# Patient Record
Sex: Female | Born: 1970 | Race: Asian | Hispanic: No | Smoking: Never smoker
Health system: Southern US, Community
[De-identification: ages and names within clinical notes are randomized; demographics above are authoritative.]

## PROBLEM LIST (undated history)

## (undated) DIAGNOSIS — J309 Allergic rhinitis, unspecified: Secondary | ICD-10-CM

## (undated) DIAGNOSIS — M1990 Unspecified osteoarthritis, unspecified site: Secondary | ICD-10-CM

## (undated) DIAGNOSIS — R634 Abnormal weight loss: Secondary | ICD-10-CM

## (undated) DIAGNOSIS — F419 Anxiety disorder, unspecified: Secondary | ICD-10-CM

## (undated) DIAGNOSIS — B1910 Unspecified viral hepatitis B without hepatic coma: Secondary | ICD-10-CM

## (undated) DIAGNOSIS — B181 Chronic viral hepatitis B without delta-agent: Secondary | ICD-10-CM

## (undated) DIAGNOSIS — K219 Gastro-esophageal reflux disease without esophagitis: Secondary | ICD-10-CM

## (undated) DIAGNOSIS — M549 Dorsalgia, unspecified: Secondary | ICD-10-CM

## (undated) DIAGNOSIS — K2970 Gastritis, unspecified, without bleeding: Secondary | ICD-10-CM

## (undated) DIAGNOSIS — G43909 Migraine, unspecified, not intractable, without status migrainosus: Secondary | ICD-10-CM

## (undated) DIAGNOSIS — I10 Essential (primary) hypertension: Secondary | ICD-10-CM

## (undated) HISTORY — DX: Unspecified osteoarthritis, unspecified site: M19.90

## (undated) HISTORY — DX: Anxiety disorder, unspecified: F41.9

## (undated) HISTORY — PX: OTHER SURGICAL HISTORY: SHX169

## (undated) HISTORY — DX: Migraine, unspecified, not intractable, without status migrainosus: G43.909

## (undated) HISTORY — DX: Abnormal weight loss: R63.4

## (undated) HISTORY — DX: Dorsalgia, unspecified: M54.9

## (undated) HISTORY — PX: CHOLECYSTECTOMY: SHX55

## (undated) HISTORY — DX: Gastro-esophageal reflux disease without esophagitis: K21.9

## (undated) HISTORY — DX: Essential (primary) hypertension: I10

## (undated) HISTORY — DX: Allergic rhinitis, unspecified: J30.9

## (undated) HISTORY — DX: Gastritis, unspecified, without bleeding: K29.70

## (undated) HISTORY — DX: Unspecified viral hepatitis B without hepatic coma: B19.10

## (undated) HISTORY — DX: Chronic viral hepatitis B without delta-agent: B18.1

---

## 1998-02-16 ENCOUNTER — Encounter: Payer: Self-pay | Admitting: Emergency Medicine

## 1998-02-16 ENCOUNTER — Emergency Department (HOSPITAL_COMMUNITY): Admission: EM | Admit: 1998-02-16 | Discharge: 1998-02-16 | Payer: Self-pay | Admitting: Emergency Medicine

## 1998-08-01 ENCOUNTER — Emergency Department (HOSPITAL_COMMUNITY): Admission: EM | Admit: 1998-08-01 | Discharge: 1998-08-01 | Payer: Self-pay | Admitting: Emergency Medicine

## 1998-08-01 ENCOUNTER — Encounter: Payer: Self-pay | Admitting: Emergency Medicine

## 1998-08-19 ENCOUNTER — Encounter: Payer: Self-pay | Admitting: Family Medicine

## 1998-08-19 ENCOUNTER — Ambulatory Visit (HOSPITAL_COMMUNITY): Admission: RE | Admit: 1998-08-19 | Discharge: 1998-08-19 | Payer: Self-pay | Admitting: Family Medicine

## 1999-06-27 ENCOUNTER — Encounter: Payer: Self-pay | Admitting: Emergency Medicine

## 1999-06-27 ENCOUNTER — Emergency Department (HOSPITAL_COMMUNITY): Admission: EM | Admit: 1999-06-27 | Discharge: 1999-06-27 | Payer: Self-pay | Admitting: *Deleted

## 1999-07-03 ENCOUNTER — Encounter (INDEPENDENT_AMBULATORY_CARE_PROVIDER_SITE_OTHER): Payer: Self-pay | Admitting: Specialist

## 1999-07-03 ENCOUNTER — Observation Stay (HOSPITAL_COMMUNITY): Admission: RE | Admit: 1999-07-03 | Discharge: 1999-07-05 | Payer: Self-pay

## 1999-07-27 ENCOUNTER — Encounter: Admission: RE | Admit: 1999-07-27 | Discharge: 1999-07-27 | Payer: Self-pay

## 1999-12-31 ENCOUNTER — Encounter: Payer: Self-pay | Admitting: Family Medicine

## 1999-12-31 ENCOUNTER — Ambulatory Visit (HOSPITAL_COMMUNITY): Admission: RE | Admit: 1999-12-31 | Discharge: 1999-12-31 | Payer: Self-pay | Admitting: Family Medicine

## 2000-03-24 ENCOUNTER — Encounter: Payer: Self-pay | Admitting: *Deleted

## 2000-03-24 ENCOUNTER — Encounter: Admission: RE | Admit: 2000-03-24 | Discharge: 2000-03-24 | Payer: Self-pay | Admitting: *Deleted

## 2002-08-12 HISTORY — PX: LIVER BIOPSY: SHX301

## 2002-08-14 ENCOUNTER — Encounter: Payer: Self-pay | Admitting: *Deleted

## 2002-08-14 ENCOUNTER — Ambulatory Visit (HOSPITAL_COMMUNITY): Admission: RE | Admit: 2002-08-14 | Discharge: 2002-08-14 | Payer: Self-pay | Admitting: *Deleted

## 2002-08-14 ENCOUNTER — Encounter (INDEPENDENT_AMBULATORY_CARE_PROVIDER_SITE_OTHER): Payer: Self-pay | Admitting: Specialist

## 2002-09-12 HISTORY — PX: ESOPHAGOGASTRODUODENOSCOPY: SHX1529

## 2002-10-04 ENCOUNTER — Ambulatory Visit (HOSPITAL_COMMUNITY): Admission: RE | Admit: 2002-10-04 | Discharge: 2002-10-04 | Payer: Self-pay | Admitting: *Deleted

## 2003-10-01 ENCOUNTER — Other Ambulatory Visit: Admission: RE | Admit: 2003-10-01 | Discharge: 2003-10-01 | Payer: Self-pay | Admitting: Gynecology

## 2004-04-21 ENCOUNTER — Inpatient Hospital Stay (HOSPITAL_COMMUNITY): Admission: AD | Admit: 2004-04-21 | Discharge: 2004-04-21 | Payer: Self-pay | Admitting: Gynecology

## 2004-04-30 ENCOUNTER — Other Ambulatory Visit: Admission: RE | Admit: 2004-04-30 | Discharge: 2004-04-30 | Payer: Self-pay | Admitting: Gynecology

## 2004-07-11 ENCOUNTER — Emergency Department (HOSPITAL_COMMUNITY): Admission: EM | Admit: 2004-07-11 | Discharge: 2004-07-11 | Payer: Self-pay | Admitting: Emergency Medicine

## 2004-07-12 ENCOUNTER — Inpatient Hospital Stay (HOSPITAL_COMMUNITY): Admission: RE | Admit: 2004-07-12 | Discharge: 2004-07-16 | Payer: Self-pay | Admitting: Gynecology

## 2004-08-10 ENCOUNTER — Inpatient Hospital Stay (HOSPITAL_COMMUNITY): Admission: AD | Admit: 2004-08-10 | Discharge: 2004-08-12 | Payer: Self-pay | Admitting: Gynecology

## 2004-11-30 ENCOUNTER — Inpatient Hospital Stay (HOSPITAL_COMMUNITY): Admission: AD | Admit: 2004-11-30 | Discharge: 2004-12-02 | Payer: Self-pay | Admitting: Gynecology

## 2006-07-25 ENCOUNTER — Inpatient Hospital Stay (HOSPITAL_COMMUNITY): Admission: RE | Admit: 2006-07-25 | Discharge: 2006-07-27 | Payer: Self-pay | Admitting: Obstetrics & Gynecology

## 2007-03-02 ENCOUNTER — Encounter: Admission: RE | Admit: 2007-03-02 | Discharge: 2007-03-02 | Payer: Self-pay | Admitting: Gastroenterology

## 2008-07-09 ENCOUNTER — Ambulatory Visit: Payer: Self-pay | Admitting: Diagnostic Radiology

## 2008-07-09 ENCOUNTER — Emergency Department (HOSPITAL_BASED_OUTPATIENT_CLINIC_OR_DEPARTMENT_OTHER): Admission: EM | Admit: 2008-07-09 | Discharge: 2008-07-09 | Payer: Self-pay | Admitting: Emergency Medicine

## 2008-11-12 ENCOUNTER — Other Ambulatory Visit: Admission: RE | Admit: 2008-11-12 | Discharge: 2008-11-12 | Payer: Self-pay | Admitting: Family Medicine

## 2009-06-17 ENCOUNTER — Encounter: Admission: RE | Admit: 2009-06-17 | Discharge: 2009-06-17 | Payer: Self-pay | Admitting: Family Medicine

## 2010-02-01 ENCOUNTER — Encounter: Payer: Self-pay | Admitting: Family Medicine

## 2010-04-20 LAB — DIFFERENTIAL
Basophils Absolute: 0 10*3/uL (ref 0.0–0.1)
Basophils Relative: 0 % (ref 0–1)
Lymphs Abs: 0.9 10*3/uL (ref 0.7–4.0)
Monocytes Absolute: 0.3 10*3/uL (ref 0.1–1.0)
Monocytes Relative: 8 % (ref 3–12)
Neutrophils Relative %: 64 % (ref 43–77)

## 2010-04-20 LAB — COMPREHENSIVE METABOLIC PANEL
Albumin: 4.7 g/dL (ref 3.5–5.2)
Alkaline Phosphatase: 88 U/L (ref 39–117)
BUN: 9 mg/dL (ref 6–23)
Calcium: 9.7 mg/dL (ref 8.4–10.5)
GFR calc Af Amer: >60 mL/min (ref >60)
Potassium: 4.1 mEq/L (ref 3.5–5.1)
Sodium: 142 mEq/L (ref 135–145)
Total Bilirubin: 0.6 mg/dL (ref 0.3–1.2)

## 2010-04-20 LAB — URINALYSIS, ROUTINE W REFLEX MICROSCOPIC
Bilirubin Urine: NEGATIVE
Ketones, ur: NEGATIVE mg/dL
Leukocytes, UA: NEGATIVE
Protein, ur: NEGATIVE mg/dL
Urobilinogen, UA: 0.2 mg/dL (ref 0.0–1.0)

## 2010-04-20 LAB — CBC
MCHC: 33.3 g/dL (ref 30.0–36.0)
RBC: 4.54 MIL/uL (ref 3.87–5.11)

## 2010-04-20 LAB — PREGNANCY, URINE: Preg Test, Ur: NEGATIVE

## 2010-04-20 LAB — POCT CARDIAC MARKERS

## 2010-04-20 LAB — URINE MICROSCOPIC-ADD ON

## 2010-05-29 NOTE — Discharge Summary (Signed)
[name], [name]                   ACCOUNT NO.:  [id number]   MEDICAL RECORD NO.:  [id number]          PATIENT TYPE:  INP   LOCATION:  9317                          FACILITY:  [facility]   PHYSICIAN:  Ivor Costa. Farrel Gobble, M.D. DATE OF BIRTH:  10/11/1970   DATE OF ADMISSION:  08/10/2004  DATE OF DISCHARGE:                                 DISCHARGE SUMMARY   PRINCIPAL DIAGNOSES:  1.  Twenty-four weeks pregnancy.  2.  Suspected pyelonephritis.  3.  Chronic active hepatitis.  4.  Sciatica.   Refer to the dictated H&P.   HOSPITAL COURSE:  The patient was admitted on August 10, 2004 with subjective  complaints of fever. She had a urinalysis which was performed which was  essentially negative; however, because of her fever she had urine culture  and blood cultures also done. The patient was prophylactically put on Unasyn  q.6h. and remained afebrile throughout her hospital course. She was seen by  urology as she had been seen by urology earlier. Her urine culture came back  positive for gram negative rods which turned out to be Klebsiella pneumoniae  resistant to everything except for Bactrim. The blood cultures were no  growth to date. The patient was discharged home on hospital day #2 with a  prescription for Bactrim b.i.d. #14 with instructions to follow up with  urology. The patient will probably be placed on suppression as a result of  the recurrent urinary tract infections.   PHYSICAL EXAMINATION AT THE TIME OF DISCHARGE:  GENERAL:  She was without  complaints.  HEART:  Regular rate.  LUNGS:  Clear to auscultation.  ABDOMEN:  Gravid and nontender. She had no costovertebral angle tenderness.  EXTREMITIES:  Negative.   Discharge prescriptions as mentioned above. She will follow up with our  office this week and will call to follow up with the urologist for later  this month.      Ivor Costa. Farrel Gobble, M.D.  Electronically Signed     THL/MEDQ  D:  08/12/2004  T:  08/12/2004  Job:   [id number]

## 2010-05-29 NOTE — Op Note (Signed)
[facility]  Patient:    [name], [name]                            MRN: [id number] Proc. Date: 07/03/99 Adm. Date:  [id number] Disc. Date: [id number] Attending:  Meredith Leeds CC:         Wilhemina Bonito. Eda Keys., M.D. [facility]                           Operative Report  PREOPERATIVE DIAGNOSIS:  Chololithiasis, possible choledocholithiasis.  POSTOPERATIVE DIAGNOSIS:  Chololithiasis and choledocholithiasis.  OPERATION:  Laparoscopic cholecystectomy with operative cholangiogram and attempt at common duct exploration.  SURGEON:  Zigmund Daniel, M.D.  ANESTHESIA:  General.  DESCRIPTION OF PROCEDURE:  After adequate anesthesia, monitoring, and routine preparation and draping of the abdomen, I made a short infraumbilical incision and dissected down to the fascia, opened it longitudinally, open the peritoneum bluntly, placed an 0 Vicryl pursestring in the fascia and secured a Hasson cannula, then inflated the abdomen with CO2.  Laparoscopy disclosed no abnormalities.  I put in three additional ports, positioned the patient head up, foot down and tilted to the left, then elevated the fundus of the gallbladder over the liver, grasped the infundibulum and retracted it laterally.  Because the patient was quite slender, the anatomy was quite clear.  The common bile duct appeared slightly dilated.  The cystic duct was small.  I dissected out the cystic duct and the cystic artery.  I clipped the cystic artery with three clips and cut between the two clips closer to the gallbladder.  I put a clip on the distal infundibulum of the gallbladder and made a small hole in the proximal part of the cystic duct and then put in a cholangiogram catheter and secured it with a clip.  There was free flow of saline and contrast.  I did a fluoroscopic cholangiogram which demonstrated the filling defects in the mid part of the common bile duct.  There was free flow of the contrast into  the duodenum.  The bile duct was somewhat large.  I withdrew the cholangiogram catheter and then attempted to dilate the cystic duct to put in a choledochoscope.  I dilated it with the forks of the right angle dissector, attempted passage of a 3 mm dilator over a guidewire.  I could not get even the 3 mm dilator to go into the cystic duct and it appeared to be about to break into.  I felt that exploration the common duct through the cystic duct would not very likely be satisfactory.  I thought the method to proceed at that point would be to finish the operation and have ERCP done postoperatively.  I removed the wire and the dilators and then placed three clips distally on the cystic duct and divided it.  I dissected the gallbladder out of the liver bed using the spatula and the L-hook dissector with cautery. Hemostasis was good.  There was no evident leakage of bile.  All the clips appeared to be secure and hemostasis was excellent.  I removed the gallbladder from the abdomen through the umbilical incision and tied the pursestring suture.  I anesthetized all the incisions with 0.5% Marcaine with epinephrine. After relief of the CO2 I removed all the ports and closed the incisions with intracuticular 4-0 Vicryl and Steri-Strips.  The patient tolerated the operation well. DD:  07/03/99  TD:  07/06/99 Job: [id number]

## 2010-05-29 NOTE — H&P (Signed)
[name], [name]                   ACCOUNT NO.:  [id number]   MEDICAL RECORD NO.:  [id number]          PATIENT TYPE:  INP   LOCATION:  9127                          FACILITY:  [facility]   PHYSICIAN:  Ivor Costa. Farrel Gobble, M.D. DATE OF BIRTH:  11-03-1970   DATE OF ADMISSION:  11/30/2004  DATE OF DISCHARGE:                                HISTORY & PHYSICAL   CHIEF COMPLAINT:  Labor.   HISTORY OF PRESENT ILLNESS:  The patient is a questionable 40 year old G2,  P1 whose LMP was February 20, 2004, estimated date of confinement is November 27, 2004, estimated gestational age is 40-3/7 weeks who presented to the  office today with plans for induction in the morning of December 01, 2004  with complaints of contractions since early today.  She is currently  contracting by history about every half-hour. She was noted to be 4 cm  dilated with a bulging bag and minus 2 station.  The patient had light  vaginal spotting only. She reports good fetal movement.  Her pregnancy has  been complicated by chronic active hepatitis.  She also a history of  thrombocytopenia secondary to her hepatitis.  She was on Hepsera at the  point when she conceived this pregnancy. The patient is unsure of her birth  year but states that she is at least 40 years old but no amniocentesis was  done because of her chronic active hepatitis.  her platelet counts and liver  function tests have been checked regularly secondary to the hepatitis.  Her  last platelet count was 168,000.  She is B positive, antibody negative,  Rubella immune, hepatitis B surface antigen reactive, human immunodeficiency  virus nonreactive. AFP was declined.  GBS negative.  Glucola was 119.  The  patient had hepatitis A and hepatitis C serologies also done during this  pregnancy both of which were negative.  She is positive for hepatitis A  antibody, however, she is negative entirely for hepatitis C.  Please refer  to the [facility] for further information.   PHYSICAL EXAMINATION:  GENERAL APPEARANCE:  She is a well-appearing gravida  in mild distress with contractions.  VITAL SIGNS:  Blood pressure 102/60, weight 135 which is a 47 pound weight  gain.  Her urine dip was negative.  HEART:  Regular rate.  LUNGS:  Clear to auscultation.  ABDOMEN:  Gravid, soft, nontender with fundal height of 34 cm.  PELVIC:  On vaginal examination she was 4 cm, 50, minus 2 with a bulging  bag.  EXTREMITIES: Negative.   ASSESSMENT:  Term pregnancy in labor with chronic active hepatitis with  resultant thrombocytopenia not present at this point.  The patient was  transferred from our office to [facility] for management of her labor  including AROM and Pit. if necessary.      Ivor Costa. Farrel Gobble, M.D.  Electronically Signed     THL/MEDQ  D:  11/30/2004  T:  11/30/2004  Job:  [id number]

## 2010-05-29 NOTE — Consult Note (Signed)
[name], [name]                   ACCOUNT NO.:  [id number]   MEDICAL RECORD NO.:  [id number]          PATIENT TYPE:  INP   LOCATION:  9317                          FACILITY:  [facility]   PHYSICIAN:  Ronald L. Earlene Plater, M.D.  DATE OF BIRTH:  10/11/1970   DATE OF CONSULTATION:  DATE OF DISCHARGE:                                   CONSULTATION   REASON FOR CONSULTATION:  This is a consult note for patient being  readmitted for increased temperature, right flank pain, and previous right  hydronephrosis, possible kidney stone. Over the weekend on Saturday, the  patient began  having right flank pain again, increased temperature, no  hematuria, no nausea, no vomiting, and has had diarrhea for the last 2  weeks. Urgency and frequency as expected with pregnancy. Her pregnancy EDC  is November 26, 2004 with a gestational age of [redacted] weeks and four-sevenths  days.   UROLOGICAL HISTORY:  She has a remote history of kidney stones. She was  previously hospitalized in early July for right flank pain, increased  temperature. On renal ultrasound was found to have right hydronephrosis and  right hydroureter and suspected kidney stone. At that point her urine  culture was negative. She was followed up at [facility]. Renal  ultrasound was repeated after hospitalization and showed a decrease in right  hydronephrosis and a clear urine.   PAST MEDICAL HISTORY:  Includes hepatitis B previously on Hepsera, and GERD.   MEDICATIONS:  Include Nexium daily and prenatal vitamin daily.   ALLERGIES:  Include an ANTIREFLUX MEDICATION.   SOCIAL HISTORY:  Negative alcohol, negative tobacco, negative illicit drug  use.   REVIEW OF SYSTEMS:  Otherwise negative.   PHYSICAL EXAMINATION:  GENERAL:  Well-appearing 40 year old gravid Asian  female, no acute distress.  VITAL SIGNS:  Stable, afebrile. Temperature max 100.3 on admission.  HEENT:  Atraumatic.  ABDOMEN:  Soft, gravid, nontender. Negative CVAT.  SKIN:   Warm and dry.  NEUROLOGIC:  Alert and oriented x3.  LOWER EXTREMITIES:  Negative edema.   LABORATORY DATA:  Urine culture showed greater than 100,000 colonies of gram  negative rods. Her blood cultures are no growth. GC and chlamydia negative.  Renal ultrasound on this hospitalization shows no hydronephrosis, no  evidence of renal calculi, some right-sided ureterectasis present; however,  that is decreased from her renal ultrasound on July 16, 2004. Ureteral jets  are not visualized due to nearly complete bladder emptying.   IMPRESSION:  1.  Pregnancy at 24 and four-sevenths weeks - estimated date of confinement      November 26, 2004.  2.  Pyelonephritis with gram negative rods.  3.  Hepatitis B, chronic.  4.  Gastroesophageal reflux disease.   PLAN:  Continue current antibiotic therapy. Would recommend prophylactic  antibiotic treatment once current course is completed. Would recommend  cephalexin 125 mg p.o. daily. The patient currently has follow-up office  visit scheduled at [facility] in September. Would recommend keeping  that appointment and we will certainly continue to follow the patient during  this pregnancy.  JML/MEDQ  D:  08/11/2004  T:  08/11/2004  Job:  [id number]

## 2010-05-29 NOTE — Procedures (Signed)
[facility]  Patient:    [name], [name]                            MRN: [id number] Proc. Date: 07/04/99 Adm. Date:  [id number] Disc. Date: [id number] Attending:  Meredith Leeds CC:         Lebron Conners, M.D.                           Procedure Report  PROCEDURE:  Endoscopic retrograde cholangiopancreatography with biliary sphincterotomy.  Balloon sweeps of the common bile duct.  INDICATION:  Choledocholithiasis.  HISTORY:  This is a pleasant 40 year old [place] female who underwent laparoscopic cholecystectomy yesterday for symptomatic cholelithiasis.  She had mildly elevated preoperative liver tests.  Intraoperative cholangiogram suggested several filling defects in the common hepatic duct consistent with retained stones.  She was evaluated thoroughly in consultation yesterday for consideration of ERCP with sphincterotomy and stone extraction.  She was deemed an appropriate candidate with no contraindications.  The nature of this procedure as well as its risks, benefits and alternatives were reviewed in detail.  She understood and agreed to proceed.  PHYSICAL EXAMINATION:  GENERAL:  Well-appearing female in no acute distress.  She is alert and oriented.  VITAL SIGNS:  Stable.  LUNGS:  Clear.  HEART:  Regular.  ABDOMEN:  Soft with tenderness in and around the surgical stab wound site.  DESCRIPTION OF PROCEDURE:  After informed consent was obtained the patient was sedated with 50 mg of Demerol and 5 mg of Versed IV.  Glucagon 0.5 mg IV was given as a duodenal relaxant.  Unasyn 1.5 grams IV was given pre-procedurally. The Olympus side-viewing endoscope was passed blindly into the esophagus.  The stomach, duodenal bulb and post-bulbar duodenum were normal.  The major papilla was normal.  The minor papilla was not sought.  X-RAY FINDINGS: 1. Scout radiograph of the abdomen with the endoscope in position revealed    cholecystectomy clips  in the right upper quadrant. 2. Initial injection of contrast via the major papillae yielded normal    complete pancreatogram. 3. Access to the common bile duct was achieved via a hydrophilic wire.  The    cutting cannula was advanced into the bile duct.  Bile aspirated to    confirm location.  Subsequently complete filling of the biliary tree was    achieved.  The common bile duct diameter was approximately 5 mm.  There    was no evidence of filling defects in any portion of the common bile duct.    No evidence of bile leak.  THERAPY:  Despite lack of obvious filling defects biliary sphincterotomy was carried out due to the appearance of yesterdays radiograph as well as abnormal liver tests.  Moderate sized biliary sphincterotomy was made, cutting over the guidewire in the 12 oclock orientation.  Subsequently an 8.5 mm balloon was swept through the entire bile duct on several occasions.  No stones or debris extracted.  Post-procedure drainage of the biliary tree was excellent.  IMPRESSION:  Status post ERCP with biliary sphincterotomy and negative balloon sweeps of the bile duct as described above.  RECOMMENDATIONS:  Standard post-procedure orders with anticipated discharge in a.m. DD:  07/04/99 TD:  07/06/99 Job: [id number]

## 2010-07-03 ENCOUNTER — Other Ambulatory Visit: Payer: Self-pay | Admitting: Gastroenterology

## 2010-07-03 DIAGNOSIS — B181 Chronic viral hepatitis B without delta-agent: Secondary | ICD-10-CM

## 2010-08-14 ENCOUNTER — Ambulatory Visit (HOSPITAL_COMMUNITY)
Admission: RE | Admit: 2010-08-14 | Discharge: 2010-08-14 | Disposition: A | Discharge status: Home / Self Care | Payer: BC Managed Care – PPO | Source: Ambulatory Visit | Attending: Gastroenterology | Admitting: Gastroenterology

## 2010-08-14 DIAGNOSIS — D1803 Hemangioma of intra-abdominal structures: Secondary | ICD-10-CM | POA: Insufficient documentation

## 2010-08-14 DIAGNOSIS — B181 Chronic viral hepatitis B without delta-agent: Secondary | ICD-10-CM

## 2010-10-26 LAB — CBC
HCT: 25.9 — ABNORMAL LOW
MCV: 85.3
Platelets: 126 — ABNORMAL LOW
RDW: 14.2 — ABNORMAL HIGH

## 2010-10-27 LAB — CBC
HCT: 30.8 — ABNORMAL LOW
Hemoglobin: 10.3 — ABNORMAL LOW
MCV: 85.7
RBC: 3.59 — ABNORMAL LOW

## 2010-10-27 LAB — RPR: RPR Ser Ql: NONREACTIVE

## 2011-04-20 ENCOUNTER — Other Ambulatory Visit: Payer: Self-pay | Admitting: Gastroenterology

## 2011-04-20 DIAGNOSIS — B1910 Unspecified viral hepatitis B without hepatic coma: Secondary | ICD-10-CM

## 2011-04-20 DIAGNOSIS — C220 Liver cell carcinoma: Secondary | ICD-10-CM

## 2011-05-12 ENCOUNTER — Other Ambulatory Visit (HOSPITAL_COMMUNITY): Payer: BC Managed Care – PPO

## 2011-06-30 ENCOUNTER — Other Ambulatory Visit: Payer: Self-pay | Admitting: Gastroenterology

## 2011-06-30 DIAGNOSIS — B1910 Unspecified viral hepatitis B without hepatic coma: Secondary | ICD-10-CM

## 2011-07-12 ENCOUNTER — Other Ambulatory Visit (HOSPITAL_COMMUNITY): Payer: BC Managed Care – PPO

## 2011-08-13 ENCOUNTER — Ambulatory Visit (HOSPITAL_COMMUNITY)
Admission: RE | Admit: 2011-08-13 | Discharge: 2011-08-13 | Disposition: A | Discharge status: Home / Self Care | Payer: BC Managed Care – PPO | Source: Ambulatory Visit | Attending: Gastroenterology | Admitting: Gastroenterology

## 2011-08-13 DIAGNOSIS — B1910 Unspecified viral hepatitis B without hepatic coma: Secondary | ICD-10-CM | POA: Insufficient documentation

## 2011-08-13 DIAGNOSIS — Z9089 Acquired absence of other organs: Secondary | ICD-10-CM | POA: Insufficient documentation

## 2011-08-13 DIAGNOSIS — D1803 Hemangioma of intra-abdominal structures: Secondary | ICD-10-CM | POA: Insufficient documentation

## 2012-06-13 DIAGNOSIS — B181 Chronic viral hepatitis B without delta-agent: Secondary | ICD-10-CM | POA: Insufficient documentation

## 2012-06-20 ENCOUNTER — Other Ambulatory Visit: Payer: Self-pay | Admitting: Gastroenterology

## 2012-06-20 DIAGNOSIS — B181 Chronic viral hepatitis B without delta-agent: Secondary | ICD-10-CM

## 2012-07-05 ENCOUNTER — Ambulatory Visit (HOSPITAL_COMMUNITY)
Admission: RE | Admit: 2012-07-05 | Discharge: 2012-07-05 | Disposition: A | Discharge status: Home / Self Care | Payer: BC Managed Care – PPO | Source: Ambulatory Visit | Attending: Gastroenterology | Admitting: Gastroenterology

## 2012-07-05 DIAGNOSIS — B181 Chronic viral hepatitis B without delta-agent: Secondary | ICD-10-CM

## 2012-08-14 ENCOUNTER — Ambulatory Visit (HOSPITAL_COMMUNITY)
Admission: RE | Admit: 2012-08-14 | Discharge: 2012-08-14 | Disposition: A | Discharge status: Home / Self Care | Payer: BC Managed Care – PPO | Source: Ambulatory Visit | Attending: Gastroenterology | Admitting: Gastroenterology

## 2012-08-14 DIAGNOSIS — B1910 Unspecified viral hepatitis B without hepatic coma: Secondary | ICD-10-CM | POA: Insufficient documentation

## 2012-08-14 DIAGNOSIS — K7689 Other specified diseases of liver: Secondary | ICD-10-CM | POA: Insufficient documentation

## 2012-10-13 ENCOUNTER — Other Ambulatory Visit: Payer: Self-pay | Admitting: Gastroenterology

## 2012-10-13 DIAGNOSIS — R1013 Epigastric pain: Secondary | ICD-10-CM

## 2012-10-18 ENCOUNTER — Ambulatory Visit
Admission: RE | Admit: 2012-10-18 | Discharge: 2012-10-18 | Disposition: A | Discharge status: Home / Self Care | Payer: BC Managed Care – PPO | Source: Ambulatory Visit | Attending: Gastroenterology | Admitting: Gastroenterology

## 2012-10-18 DIAGNOSIS — R1013 Epigastric pain: Secondary | ICD-10-CM

## 2012-10-18 MED ORDER — IOHEXOL 300 MG/ML  SOLN
80.0000 mL | Freq: Once | INTRAMUSCULAR | Status: AC | PRN
  Administered 2012-10-18: 80 mL via INTRAVENOUS

## 2012-10-25 ENCOUNTER — Other Ambulatory Visit: Payer: Self-pay | Admitting: Gastroenterology

## 2012-10-25 DIAGNOSIS — N852 Hypertrophy of uterus: Secondary | ICD-10-CM

## 2012-11-28 ENCOUNTER — Ambulatory Visit
Admission: RE | Admit: 2012-11-28 | Discharge: 2012-11-28 | Disposition: A | Discharge status: Home / Self Care | Payer: BC Managed Care – PPO | Source: Ambulatory Visit | Attending: Gastroenterology | Admitting: Gastroenterology

## 2012-11-28 DIAGNOSIS — N852 Hypertrophy of uterus: Secondary | ICD-10-CM

## 2013-01-02 ENCOUNTER — Other Ambulatory Visit: Payer: Self-pay | Admitting: Obstetrics & Gynecology

## 2013-01-02 ENCOUNTER — Other Ambulatory Visit (HOSPITAL_COMMUNITY)
Admission: RE | Admit: 2013-01-02 | Discharge: 2013-01-02 | Disposition: A | Discharge status: Home / Self Care | Payer: BC Managed Care – PPO | Source: Ambulatory Visit | Attending: Obstetrics & Gynecology | Admitting: Obstetrics & Gynecology

## 2013-01-02 DIAGNOSIS — Z01419 Encounter for gynecological examination (general) (routine) without abnormal findings: Secondary | ICD-10-CM | POA: Insufficient documentation

## 2013-01-02 DIAGNOSIS — Z1151 Encounter for screening for human papillomavirus (HPV): Secondary | ICD-10-CM | POA: Insufficient documentation

## 2013-01-02 DIAGNOSIS — N760 Acute vaginitis: Secondary | ICD-10-CM | POA: Insufficient documentation

## 2013-01-03 ENCOUNTER — Other Ambulatory Visit: Payer: Self-pay | Admitting: Obstetrics & Gynecology

## 2013-01-03 DIAGNOSIS — N6310 Unspecified lump in the right breast, unspecified quadrant: Secondary | ICD-10-CM

## 2013-01-12 ENCOUNTER — Inpatient Hospital Stay: Admission: RE | Admit: 2013-01-12 | Payer: BC Managed Care – PPO | Source: Ambulatory Visit

## 2013-01-12 ENCOUNTER — Other Ambulatory Visit: Payer: BC Managed Care – PPO

## 2013-02-02 ENCOUNTER — Other Ambulatory Visit: Payer: BC Managed Care – PPO

## 2013-02-07 ENCOUNTER — Other Ambulatory Visit: Payer: Self-pay | Admitting: Family Medicine

## 2013-02-07 ENCOUNTER — Ambulatory Visit
Admission: RE | Admit: 2013-02-07 | Discharge: 2013-02-07 | Disposition: A | Discharge status: Home / Self Care | Payer: BC Managed Care – PPO | Source: Ambulatory Visit | Attending: Family Medicine | Admitting: Family Medicine

## 2013-02-07 DIAGNOSIS — J069 Acute upper respiratory infection, unspecified: Secondary | ICD-10-CM

## 2013-03-22 ENCOUNTER — Other Ambulatory Visit (HOSPITAL_COMMUNITY): Payer: Self-pay | Admitting: *Deleted

## 2013-03-22 DIAGNOSIS — C220 Liver cell carcinoma: Secondary | ICD-10-CM

## 2013-03-22 DIAGNOSIS — B182 Chronic viral hepatitis C: Secondary | ICD-10-CM

## 2013-04-25 ENCOUNTER — Ambulatory Visit (HOSPITAL_COMMUNITY)
Admission: RE | Admit: 2013-04-25 | Discharge: 2013-04-25 | Disposition: A | Discharge status: Home / Self Care | Payer: BC Managed Care – PPO | Source: Ambulatory Visit | Attending: Obstetrics and Gynecology | Admitting: Obstetrics and Gynecology

## 2013-04-25 DIAGNOSIS — D1803 Hemangioma of intra-abdominal structures: Secondary | ICD-10-CM | POA: Insufficient documentation

## 2013-04-25 DIAGNOSIS — C220 Liver cell carcinoma: Secondary | ICD-10-CM

## 2013-04-25 DIAGNOSIS — B1910 Unspecified viral hepatitis B without hepatic coma: Secondary | ICD-10-CM | POA: Insufficient documentation

## 2013-09-14 ENCOUNTER — Ambulatory Visit (INDEPENDENT_AMBULATORY_CARE_PROVIDER_SITE_OTHER): Payer: BC Managed Care – PPO | Admitting: Family Medicine

## 2013-09-14 VITALS — BP 100/68 | HR 65 | Temp 98.0°F | Resp 18 | Ht 61.0 in | Wt 94.0 lb

## 2013-09-14 DIAGNOSIS — R5381 Other malaise: Secondary | ICD-10-CM

## 2013-09-14 DIAGNOSIS — Z862 Personal history of diseases of the blood and blood-forming organs and certain disorders involving the immune mechanism: Secondary | ICD-10-CM

## 2013-09-14 DIAGNOSIS — B1910 Unspecified viral hepatitis B without hepatic coma: Secondary | ICD-10-CM

## 2013-09-14 DIAGNOSIS — R5383 Other fatigue: Secondary | ICD-10-CM

## 2013-09-14 DIAGNOSIS — Z0000 Encounter for general adult medical examination without abnormal findings: Secondary | ICD-10-CM

## 2013-09-14 LAB — COMPREHENSIVE METABOLIC PANEL
ALBUMIN: 4.1 g/dL (ref 3.5–5.2)
ALT: 22 U/L (ref 0–35)
AST: 27 U/L (ref 0–37)
Alkaline Phosphatase: 40 U/L (ref 39–117)
BUN: 13 mg/dL (ref 6–23)
CO2: 28 mEq/L (ref 19–32)
Calcium: 8.8 mg/dL (ref 8.4–10.5)
Chloride: 106 mEq/L (ref 96–112)
Creat: 0.73 mg/dL (ref 0.50–1.10)
Glucose, Bld: 72 mg/dL (ref 70–99)
POTASSIUM: 4.2 meq/L (ref 3.5–5.3)
Sodium: 138 mEq/L (ref 135–145)
TOTAL PROTEIN: 7 g/dL (ref 6.0–8.3)
Total Bilirubin: 0.5 mg/dL (ref 0.2–1.2)

## 2013-09-14 LAB — POCT CBC
Granulocyte percent: 63.1 %G (ref 37–80)
HCT, POC: 36.5 % — AB (ref 37.7–47.9)
Hemoglobin: 11.9 g/dL — AB (ref 12.2–16.2)
Lymph, poc: 0.8 (ref 0.6–3.4)
MCH, POC: 28.9 pg (ref 27–31.2)
MCHC: 32.5 g/dL (ref 31.8–35.4)
MCV: 89.1 fL (ref 80–97)
MID (CBC): 0.2 (ref 0–0.9)
MPV: 8.2 fL (ref 0–99.8)
PLATELET COUNT, POC: 86 10*3/uL — AB (ref 142–424)
POC Granulocyte: 1.8 — AB (ref 2–6.9)
POC LYMPH PERCENT: 29.7 %L (ref 10–50)
POC MID %: 7.2 % (ref 0–12)
RBC: 4.1 M/uL (ref 4.04–5.48)
RDW, POC: 16.1 %
WBC: 2.8 10*3/uL — AB (ref 4.6–10.2)

## 2013-09-14 LAB — LIPID PANEL
Cholesterol: 151 mg/dL (ref 0–200)
HDL: 56 mg/dL (ref >39)
LDL CALC: 87 mg/dL (ref 0–99)
Total CHOL/HDL Ratio: 2.7 Ratio
Triglycerides: 41 mg/dL (ref <150)
VLDL: 8 mg/dL (ref 0–40)

## 2013-09-14 LAB — TSH: TSH: 1.084 u[IU]/mL (ref 0.350–4.500)

## 2013-09-14 LAB — FERRITIN: FERRITIN: 11 ng/mL (ref 10–291)

## 2013-09-14 NOTE — Addendum Note (Signed)
Addended by: Ivor Reining on: 09/14/2013 11:24 AM   Modules accepted: Orders

## 2013-09-14 NOTE — Progress Notes (Signed)
Physical examination:  History: Patient is here for physical examination. It is needed for her insurance. There is a form to be completed apparently, but she does not have it with her yet. She will have to bring him back in. She has a history of hepatitis B for which she is followed every 6 months at [facility] where they do an ultrasound of her abdomen and labs. She is on chronic medication for that. She has persistent fatigue and inability to gain weight. She has history of hypertension anemia  Past medical history: Current medications: viread 300 mg, aleve, nexium, iron Allergies: Prevacid, pantoprazole, amoxicillin, metronidazole Operations:cholecystectomy, kidney stone Medical illnesses:kidney stone, hepatitis B managed by doctors at [facility]  Family history: Adopted with no history  Social history: Does not smoke drink or use drugs. Not sexually involved. Works a job. Does not exercise.  Review of systems Constitutional: Fatigue, inability to gain weight  HEENT: Unremarkable Respiratory: Unremarkable Current meds her: Unremarkable Gastrointestinal: Unremarkable Endocrinologic: Unremarkable Genitourinary: Unremarkable The skeletal: Unremarkable Dermatologic: Unremarkable Allergy/immunology: Unremarkable Neurology: Unremarkable Hematologic: Unremarkable Psychiatric: Unremarkable   Physical examination: Well-developed but very lean 43 year old [place] lady from [place] originally. TMs are normal. Eyes PERRLA. Throat clear. Neck supple without nodes or thyromegaly. No carotid bruits. Chest clear to auscultation. Heart regular without murmurs gallops or arrhythmias. Breasts are small without any masses. No axillary nodes. Abdomen soft without masses tenderness. No inguinal nodes. Extremities unremarkable. She has a couple of tattoos her abdomen and leg. She has no rashes. Pelvic exam not done as she had a Pap smear last year and is not sexually involved.  Plan: CBC,  lipids, TSH, comprehensive of metabolic panel Continue regular followups at [facility] Will give her the place she can call to arrange a mammogram sometime this fall. She's not ready to get it yet.  Return from  Results for orders placed in visit on 09/14/13  POCT CBC      Result Value Ref Range   WBC 2.8 (*) 4.6 - 10.2 K/uL   Lymph, poc 0.8  0.6 - 3.4   POC LYMPH PERCENT 29.7  10 - 50 %L   MID (cbc) 0.2  0 - 0.9   POC MID % 7.2  0 - 12 %M   POC Granulocyte 1.8 (*) 2 - 6.9   Granulocyte percent 63.1  37 - 80 %G   RBC 4.10  4.04 - 5.48 M/uL   Hemoglobin 11.9 (*) 12.2 - 16.2 g/dL   HCT, POC 36.5 (*) 37.7 - 47.9 %   MCV 89.1  80 - 97 fL   MCH, POC 28.9  27 - 31.2 pg   MCHC 32.5  31.8 - 35.4 g/dL   RDW, POC 16.1     Platelet Count, POC 86 (*) 142 - 424 K/uL   MPV 8.2  0 - 99.8 fL   She saw the doctor to [facility] last a few weeks ago and they did labs on her also. I'm sure the low white count is related to the chronic hepatitis. The anemia is not too bad.

## 2013-09-14 NOTE — Patient Instructions (Addendum)
Continue regular followups at [facility] your form information to complete  Continue current medication

## 2013-09-15 ENCOUNTER — Encounter: Payer: Self-pay | Admitting: Radiology

## 2013-09-30 ENCOUNTER — Telehealth: Payer: Self-pay

## 2013-09-30 NOTE — Telephone Encounter (Signed)
Pt dropped off a form for [place] to complete from his PE. LMOM that it is complete and ready for p/u

## 2014-02-26 ENCOUNTER — Other Ambulatory Visit (HOSPITAL_COMMUNITY): Payer: Self-pay | Admitting: Gastroenterology

## 2014-02-26 DIAGNOSIS — B181 Chronic viral hepatitis B without delta-agent: Secondary | ICD-10-CM

## 2014-03-08 ENCOUNTER — Ambulatory Visit (HOSPITAL_COMMUNITY)
Admission: RE | Admit: 2014-03-08 | Discharge: 2014-03-08 | Disposition: A | Discharge status: Home / Self Care | Payer: BLUE CROSS/BLUE SHIELD | Source: Ambulatory Visit | Attending: Gastroenterology | Admitting: Gastroenterology

## 2014-03-08 ENCOUNTER — Ambulatory Visit (HOSPITAL_COMMUNITY): Admission: RE | Admit: 2014-03-08 | Payer: BLUE CROSS/BLUE SHIELD | Source: Ambulatory Visit

## 2014-03-08 DIAGNOSIS — B181 Chronic viral hepatitis B without delta-agent: Secondary | ICD-10-CM | POA: Diagnosis present

## 2014-03-08 DIAGNOSIS — D1803 Hemangioma of intra-abdominal structures: Secondary | ICD-10-CM | POA: Insufficient documentation

## 2014-03-08 DIAGNOSIS — Z9049 Acquired absence of other specified parts of digestive tract: Secondary | ICD-10-CM | POA: Insufficient documentation

## 2014-03-11 ENCOUNTER — Other Ambulatory Visit: Payer: Self-pay | Admitting: Gastroenterology

## 2014-03-11 DIAGNOSIS — R911 Solitary pulmonary nodule: Secondary | ICD-10-CM

## 2014-04-04 ENCOUNTER — Ambulatory Visit
Admission: RE | Admit: 2014-04-04 | Discharge: 2014-04-04 | Disposition: A | Discharge status: Home / Self Care | Payer: BLUE CROSS/BLUE SHIELD | Source: Ambulatory Visit | Attending: Gastroenterology | Admitting: Gastroenterology

## 2014-04-04 DIAGNOSIS — R911 Solitary pulmonary nodule: Secondary | ICD-10-CM

## 2014-04-04 MED ORDER — IOPAMIDOL (ISOVUE-300) INJECTION 61%
75.0000 mL | Freq: Once | INTRAVENOUS | Status: AC | PRN
  Administered 2014-04-04: 75 mL via INTRAVENOUS

## 2014-05-09 LAB — TSH: TSH: 0.8 u[IU]/mL (ref 0.41–5.90)

## 2014-05-14 ENCOUNTER — Other Ambulatory Visit: Payer: Self-pay | Admitting: Family Medicine

## 2014-05-14 DIAGNOSIS — E041 Nontoxic single thyroid nodule: Secondary | ICD-10-CM

## 2014-05-21 ENCOUNTER — Ambulatory Visit
Admission: RE | Admit: 2014-05-21 | Discharge: 2014-05-21 | Disposition: A | Discharge status: Home / Self Care | Payer: BLUE CROSS/BLUE SHIELD | Source: Ambulatory Visit | Attending: Family Medicine | Admitting: Family Medicine

## 2014-05-21 DIAGNOSIS — E041 Nontoxic single thyroid nodule: Secondary | ICD-10-CM

## 2014-07-26 ENCOUNTER — Encounter: Payer: Self-pay | Admitting: Internal Medicine

## 2014-07-26 ENCOUNTER — Ambulatory Visit (INDEPENDENT_AMBULATORY_CARE_PROVIDER_SITE_OTHER): Payer: BLUE CROSS/BLUE SHIELD | Admitting: Internal Medicine

## 2014-07-26 ENCOUNTER — Other Ambulatory Visit: Payer: Self-pay | Admitting: *Deleted

## 2014-07-26 VITALS — BP 102/60 | HR 64 | Temp 98.4°F | Resp 12 | Ht 61.0 in | Wt 92.0 lb

## 2014-07-26 DIAGNOSIS — E042 Nontoxic multinodular goiter: Secondary | ICD-10-CM | POA: Diagnosis not present

## 2014-07-26 DIAGNOSIS — R634 Abnormal weight loss: Secondary | ICD-10-CM | POA: Diagnosis not present

## 2014-07-26 NOTE — Patient Instructions (Signed)
Please come back for labs at 8 am.  Call me to order another U/S in June 2017.  Please return in 1 year.

## 2014-07-26 NOTE — Progress Notes (Addendum)
Patient ID: [name], female   DOB: 24-Dec-1970, 44 y.o.   MRN: [id number]   HPI  [name] is a 44 y.o.-year-old female, referred by her Dr. Carol Ada, in consultation for multiple thyroid nodules.  She was first told she had she had thyroid nodules in the 1988 (in [place]) >> thyroid Bx >> benign. After this, she did not follow on these as moved to [place].   Patient was again found to have thyroid nodules incidentally on chest CT (04/05/2014) obtained for follow-up for lung nodule.  She then had a dedicated Thyroid U/S (05/21/2014) showing 2 dominant thyroid nodules in the left lobe:  Right thyroid lobe: 44 x 11 x 13 mm. Two small 2 mm cysts.  Left thyroid lobe: 40 x 13 x 16 mm.  - 7 x 4 x 7 mm nodule at junction with isthmus.  - 14 x 9 x 10 mm complex mostly cystic nodule, upper pole, probably corresponding to finding on recent CT.   - 12 x 7 x 10 mm complex mostly solid nodule, inferior pole.   Isthmus Thickness: 2 mm. No nodules visualized.  Lymphadenopathy: None visualized.  I reviewed pt's thyroid tests - normal: Lab Results  Component Value Date   TSH 0.80 05/09/2014   TSH 1.084 09/14/2013    Pt denies feeling nodules in neck, hoarseness, dysphagia/odynophagia, SOB with lying down.  Pt c/o: - + fatigue - + low weight (no major variations, but feels she loses more than she gains regardless of how much she eats) - + cold intolerance - no tremors - no palpitations - no anxiety/depression - no hyperdefecation/constipation, + heartburn - + dry skin - + hair loss  Patient is adopted, so no FH of thyroid ds. Is available. No h/o radiation tx to head or neck.  No seaweed or kelp, no recent contrast studies (CT scan 03/2014). No steroid use. No herbal supplements.   I reviewed her chart and she also has a history of Hepatitis B - on Tx, anemia.  ROS: Constitutional: no weight gain/loss, no fatigue, no subjective hyperthermia/hypothermia Eyes: + blurry vision, no  xerophthalmia ENT: no sore throat, no nodules palpated in throat, no dysphagia/odynophagia, no hoarseness Cardiovascular: + R CP occasionally (prev. Investigation negative)/no SOB/palpitations/+ leg swelling Respiratory: no cough/SOB Gastrointestinal: no N/V/D/C/+ heartburn Musculoskeletal: + muscle/+ joint aches - every day Skin: no rashes, + easy bruising, + itching, + hair loss Neurological: no tremors/+ R leg numbness/no tingling/+ dizziness, + HAs  Psychiatric: no depression/anxiety  Past Medical History  Diagnosis Date  . Anxiety   . Arthritis   . Hepatitis B   . Chronic hepatitis B     Followed by [facility]  . GERD (gastroesophageal reflux disease)   . Allergic rhinitis   . Back pain   . Migraine headache   . Hypertension   . Gastritis   . Weight loss    Past Surgical History  Procedure Laterality Date  . Kidney stones    . Cholecystectomy    . Liver biopsy  08/2002  . Esophagogastroduodenoscopy  09/2002   History   Social History  . Marital Status: Legally Separated    Spouse Name: N/A  . Number of Children: 2   Social History Main Topics  . Smoking status: Never Smoker   . Smokeless tobacco: Not on file  . Alcohol Use: No  . Drug Use: No   Social History Airline pilot - FT; Oncologist   Long term relationship -  lives together   2 children   Adopted from [place] (1983); family hx unknown   Current Outpatient Prescriptions on File Prior to Visit  Medication Sig Dispense Refill  . esomeprazole (NEXIUM) 40 MG capsule Take 40 mg by mouth daily at 12 noon.    . ferrous sulfate 325 (65 FE) MG tablet Take 325 mg by mouth daily with breakfast.    . Naproxen Sodium (ALEVE PO) Take by mouth as needed.    Marland Kitchen tenofovir (VIREAD) 300 MG tablet Take 300 mg by mouth daily.     No current facility-administered medications on file prior to visit.   Allergies  Allergen Reactions  . Amoxil [Amoxicillin] Rash  . Metronidazole Rash  . Pantoprazole  Rash  . Prevacid [Lansoprazole] Rash   No family history on file.  PE: BP 102/60 mmHg  Pulse 64  Temp(Src) 98.4 F (36.9 C) (Oral)  Resp 12  Ht 5\' 1"  (1.549 m)  Wt 92 lb (41.731 kg)  BMI 17.39 kg/m2  SpO2 98% Wt Readings from Last 3 Encounters:  07/26/14 92 lb (41.731 kg)  09/14/13 94 lb (42.638 kg)   Constitutional: underweight, in NAD Eyes: PERRLA, EOMI, no exophthalmos ENT: moist mucous membranes, no thyromegaly, thyroid nodules not palpable, no cervical lymphadenopathy Cardiovascular: RRR, No MRG Respiratory: CTA B Gastrointestinal: abdomen soft, NT, ND, BS+ Musculoskeletal: no deformities, strength intact in all 4;  Skin: moist, warm, no rashes Neurological: no tremor with outstretched hands, DTR normal in all 4  ASSESSMENT: 1. Multiple thyroid nodules  2. Underweight BMI Classification:  < 18.5 underweight   18.5-24.9 normal weight   25.0-29.9 overweight   30.0-34.9 class I obesity   35.0-39.9 class II obesity   ? 40.0 class III obesity   PLAN: 1. Multiple thyroid nodules - I reviewed the images of her thyroid ultrasound along with the patient. I pointed out that the R upper thyroid nodule is a cyst and the chances of it being cancerous are extremely small; the other dominant nodule is solid, this being a risk factor for cancer. Otherwise, the nodule is isoechoic, without calcifications, without internal blood flow, more wide than tall, and well delimited from surrounding tissue. It is also under the threshold for Bx >> we will need to continue to follow this, but no intervention needed for now. It is unclear if pt has a thyroid cancer family history (adopted), but no personal history of RxTx to head/neck.  - We discussed about rechecking the U/S in a year and see if the nodules grow, and only intervene at that time.  - I explained that this is not cancer, we can continue to follow her on a yearly or biennial basis - she should let me know if she develops  neck compression symptoms, in that case, we might need to do either lobectomy or thyroidectomy - I'll see her back in a year  2. Underweight  - 2/2 low weight, low BP, joint pain, HAs >> will check an 8 am cortisol and ACTH along with TFTs.  Component     Latest Ref Rng 08/02/2014  Cortisol, Plasma      7.9  TSH     0.35 - 4.50 uIU/mL 1.04  Free T4     0.60 - 1.60 ng/dL 0.84  T3, Free     2.3 - 4.2 pg/mL 3.0  C206 ACTH     6 - 50 pg/mL 21   Thyroid and adrenal labs are normal.

## 2014-08-02 ENCOUNTER — Other Ambulatory Visit (INDEPENDENT_AMBULATORY_CARE_PROVIDER_SITE_OTHER): Payer: BLUE CROSS/BLUE SHIELD

## 2014-08-02 DIAGNOSIS — R634 Abnormal weight loss: Secondary | ICD-10-CM | POA: Diagnosis not present

## 2014-08-02 DIAGNOSIS — E042 Nontoxic multinodular goiter: Secondary | ICD-10-CM | POA: Diagnosis not present

## 2014-08-02 LAB — T4, FREE: Free T4: 0.84 ng/dL (ref 0.60–1.60)

## 2014-08-02 LAB — CORTISOL: Cortisol, Plasma: 7.9 ug/dL

## 2014-08-02 LAB — TSH: TSH: 1.04 u[IU]/mL (ref 0.35–4.50)

## 2014-08-02 LAB — T3, FREE: T3 FREE: 3 pg/mL (ref 2.3–4.2)

## 2014-08-05 LAB — ACTH: C206 ACTH: 21 pg/mL (ref 6–50)

## 2014-08-05 NOTE — Addendum Note (Signed)
Addended by: Philemon Kingdom on: 08/05/2014 01:49 PM   Modules accepted: Level of Service

## 2014-08-16 ENCOUNTER — Other Ambulatory Visit (HOSPITAL_COMMUNITY): Payer: Self-pay | Admitting: Gastroenterology

## 2014-08-16 DIAGNOSIS — B181 Chronic viral hepatitis B without delta-agent: Secondary | ICD-10-CM

## 2014-09-09 ENCOUNTER — Ambulatory Visit (HOSPITAL_COMMUNITY): Admission: RE | Admit: 2014-09-09 | Payer: BLUE CROSS/BLUE SHIELD | Source: Ambulatory Visit

## 2014-09-25 ENCOUNTER — Ambulatory Visit (HOSPITAL_COMMUNITY)
Admission: RE | Admit: 2014-09-25 | Discharge: 2014-09-25 | Disposition: A | Discharge status: Home / Self Care | Payer: BLUE CROSS/BLUE SHIELD | Source: Ambulatory Visit | Attending: Gastroenterology | Admitting: Gastroenterology

## 2014-09-25 DIAGNOSIS — D1803 Hemangioma of intra-abdominal structures: Secondary | ICD-10-CM | POA: Diagnosis not present

## 2014-09-25 DIAGNOSIS — R934 Abnormal findings on diagnostic imaging of urinary organs: Secondary | ICD-10-CM | POA: Insufficient documentation

## 2014-09-25 DIAGNOSIS — B181 Chronic viral hepatitis B without delta-agent: Secondary | ICD-10-CM | POA: Diagnosis present

## 2014-09-30 ENCOUNTER — Ambulatory Visit (INDEPENDENT_AMBULATORY_CARE_PROVIDER_SITE_OTHER): Payer: BLUE CROSS/BLUE SHIELD | Admitting: Physician Assistant

## 2014-09-30 ENCOUNTER — Other Ambulatory Visit: Payer: Self-pay

## 2014-09-30 VITALS — BP 122/72 | HR 80 | Temp 98.0°F | Resp 17 | Ht 60.5 in | Wt 93.0 lb

## 2014-09-30 DIAGNOSIS — Z13220 Encounter for screening for lipoid disorders: Secondary | ICD-10-CM | POA: Diagnosis not present

## 2014-09-30 DIAGNOSIS — Z130 Encounter for screening for diseases of the blood and blood-forming organs and certain disorders involving the immune mechanism: Secondary | ICD-10-CM

## 2014-09-30 DIAGNOSIS — Z131 Encounter for screening for diabetes mellitus: Secondary | ICD-10-CM | POA: Diagnosis not present

## 2014-09-30 DIAGNOSIS — Z1239 Encounter for other screening for malignant neoplasm of breast: Secondary | ICD-10-CM | POA: Diagnosis not present

## 2014-09-30 DIAGNOSIS — Z0000 Encounter for general adult medical examination without abnormal findings: Secondary | ICD-10-CM

## 2014-09-30 DIAGNOSIS — Z23 Encounter for immunization: Secondary | ICD-10-CM | POA: Diagnosis not present

## 2014-09-30 DIAGNOSIS — Z124 Encounter for screening for malignant neoplasm of cervix: Secondary | ICD-10-CM

## 2014-09-30 DIAGNOSIS — Z1329 Encounter for screening for other suspected endocrine disorder: Secondary | ICD-10-CM | POA: Diagnosis not present

## 2014-09-30 DIAGNOSIS — N898 Other specified noninflammatory disorders of vagina: Secondary | ICD-10-CM | POA: Diagnosis not present

## 2014-09-30 DIAGNOSIS — Z1231 Encounter for screening mammogram for malignant neoplasm of breast: Secondary | ICD-10-CM

## 2014-09-30 LAB — CBC
HCT: 33.7 % — ABNORMAL LOW (ref 36.0–46.0)
Hemoglobin: 10.7 g/dL — ABNORMAL LOW (ref 12.0–15.0)
MCH: 26.3 pg (ref 26.0–34.0)
MCHC: 31.8 g/dL (ref 30.0–36.0)
MCV: 82.8 fL (ref 78.0–100.0)
MPV: 10 fL (ref 8.6–12.4)
PLATELETS: 127 10*3/uL — AB (ref 150–400)
RBC: 4.07 MIL/uL (ref 3.87–5.11)
RDW: 15 % (ref 11.5–15.5)
WBC: 3.9 10*3/uL — AB (ref 4.0–10.5)

## 2014-09-30 LAB — POCT WET PREP WITH KOH
Clue Cells Wet Prep HPF POC: NEGATIVE
KOH Prep POC: NEGATIVE
Trichomonas, UA: NEGATIVE
Yeast Wet Prep HPF POC: NEGATIVE

## 2014-09-30 LAB — LIPID PANEL
CHOL/HDL RATIO: 2.4 ratio (ref <=5.0)
Cholesterol: 157 mg/dL (ref 125–200)
HDL: 66 mg/dL (ref >=46)
LDL CALC: 74 mg/dL (ref <130)
Triglycerides: 84 mg/dL (ref <150)
VLDL: 17 mg/dL (ref <30)

## 2014-09-30 LAB — COMPREHENSIVE METABOLIC PANEL
ALK PHOS: 37 U/L (ref 33–115)
ALT: 18 U/L (ref 6–29)
AST: 24 U/L (ref 10–35)
Albumin: 4.1 g/dL (ref 3.6–5.1)
BUN: 17 mg/dL (ref 7–25)
CO2: 28 mmol/L (ref 20–31)
Calcium: 8.5 mg/dL — ABNORMAL LOW (ref 8.6–10.2)
Chloride: 102 mmol/L (ref 98–110)
Creat: 0.71 mg/dL (ref 0.50–1.10)
GLUCOSE: 70 mg/dL (ref 65–99)
POTASSIUM: 3.8 mmol/L (ref 3.5–5.3)
Sodium: 137 mmol/L (ref 135–146)
Total Bilirubin: 0.5 mg/dL (ref 0.2–1.2)
Total Protein: 7.3 g/dL (ref 6.1–8.1)

## 2014-09-30 LAB — TSH: TSH: 1.883 u[IU]/mL (ref 0.350–4.500)

## 2014-09-30 NOTE — Progress Notes (Signed)
Subjective:    Patient ID: [name], female    DOB: October 01, 1970, 44 y.o.   MRN: [id number]  HPI Patient presents for complete physical without any complaints. States that she is [place] and diet involves a lot of rice, chicken, fish, and pork with some vegetables. She has stopped drinking sodas two months ago and only drinks water and sweet tea. Does not exercise, but does a lot of walking at job at [employer]. Was last seen by her hepatologist Dr. Davene Costain 1 week ago for her hepatitis B and still doing well with Viread daily.   Is adopted and does not know family history. Surgical history positive for cholecystectomy. Has 2 children and had an abortion as a teen. Has a significant other, but is not sexually active. Has never smoked tobacco, drank alcohol, or used illicit drugs.   Health Maintenance: Eye and dental exam UTD; mammogram ordered today; pap completed today; Flu vaccine given today.  Allergies  Allergen Reactions  . Amoxil [Amoxicillin] Rash  . Metronidazole Rash  . Pantoprazole Rash  . Prevacid [Lansoprazole] Rash   Review of Systems  Constitutional: Negative.  Negative for fever, chills, activity change, appetite change, fatigue and unexpected weight change.  HENT: Negative.  Negative for tinnitus.   Eyes: Negative.  Negative for photophobia and visual disturbance.  Respiratory: Negative.  Negative for cough, shortness of breath and wheezing.   Cardiovascular: Negative.  Negative for chest pain, palpitations and leg swelling.  Gastrointestinal: Negative.  Negative for nausea, vomiting, diarrhea, constipation and blood in stool.  Genitourinary: Negative.  Negative for dysuria, hematuria and decreased urine volume.  Musculoskeletal: Negative for myalgias, back pain, joint swelling, arthralgias and gait problem.  Neurological: Negative.  Negative for dizziness, light-headedness and headaches.  Psychiatric/Behavioral: Negative.        Objective:   Physical Exam    Constitutional: She is oriented to person, place, and time. She appears well-developed and well-nourished. No distress.  Blood pressure 122/72, pulse 80, temperature 98 F (36.7 C), temperature source Oral, resp. rate 17, height 5' 0.5" (1.537 m), weight 93 lb (42.185 kg), last menstrual period 09/23/2014, SpO2 98 %.   HENT:  Head: Normocephalic and atraumatic.  Right Ear: External ear normal.  Left Ear: External ear normal.  Mouth/Throat: Oropharynx is clear and moist.  Eyes: Conjunctivae and EOM are normal. Pupils are equal, round, and reactive to light. Right eye exhibits no discharge. Left eye exhibits no discharge. No scleral icterus.  Neck: Normal range of motion. Neck supple. No JVD present. No thyromegaly present.  Cardiovascular: Normal rate, regular rhythm, normal heart sounds and intact distal pulses.  Exam reveals no gallop and no friction rub.   No murmur heard. Pulmonary/Chest: Effort normal and breath sounds normal. No respiratory distress. She has no wheezes. She has no rales. She exhibits no tenderness.  Abdominal: Soft. Bowel sounds are normal. She exhibits no distension and no mass. There is no tenderness. There is no rebound and no guarding. No hernia. Hernia confirmed negative in the right inguinal area and confirmed negative in the left inguinal area.  Genitourinary: Uterus normal. No breast swelling, tenderness, discharge or bleeding. No labial fusion. There is no rash, tenderness, lesion or injury on the right labia. There is no rash, tenderness, lesion or injury on the left labia. Cervix exhibits no motion tenderness, no discharge and no friability. Right adnexum displays no mass, no tenderness and no fullness. Left adnexum displays no mass, no tenderness and no fullness. No erythema,  tenderness or bleeding in the vagina. No foreign body around the vagina. No signs of injury around the vagina. Vaginal discharge (copious amount of whitish thin discharge) found.   Musculoskeletal: Normal range of motion. She exhibits no edema or tenderness.  Lymphadenopathy:    She has no cervical adenopathy.       Right: No inguinal adenopathy present.       Left: No inguinal adenopathy present.  Neurological: She is alert and oriented to person, place, and time. She has normal strength and normal reflexes. No cranial nerve deficit or sensory deficit. She exhibits normal muscle tone. Coordination normal.  Skin: Skin is warm and dry. No rash noted. She is not diaphoretic. No erythema. No pallor.  Psychiatric: She has a normal mood and affect. Her behavior is normal. Judgment and thought content normal.   Results for orders placed or performed in visit on 09/30/14  POCT Wet Prep with KOH  Result Value Ref Range   Trichomonas, UA Negative    Clue Cells Wet Prep HPF POC Negative    Epithelial Wet Prep HPF POC Few Few, Moderate, Many   Yeast Wet Prep HPF POC Negative    Bacteria Wet Prep HPF POC None None, Few   RBC Wet Prep HPF POC Few    WBC Wet Prep HPF POC Few    KOH Prep POC Negative       Assessment & Plan:  1. Annual physical exam Anticipatory guidance discussed. Advised to increase exercise.   2. Vaginal discharge - POCT Wet Prep with KOH  3. Screening for deficiency anemia - CBC  4. Screening for diabetes mellitus - Comprehensive metabolic panel  5. Screening for lipid disorders - Lipid panel  6. Screening for thyroid disorder - TSH  7. Screening for cervical cancer - Pap IG w/ reflex to HPV when ASC-U  8. Need for influenza vaccination - Flu Vaccine QUAD 36+ mos IM  9. Screening for breast cancer - MM Digital Diagnostic Bilat; Future   Alveta Heimlich PA-C  [facility] 09/30/2014 9:41 AM

## 2014-09-30 NOTE — Patient Instructions (Signed)

## 2014-10-01 LAB — PAP IG W/ RFLX HPV ASCU

## 2014-10-03 LAB — HUMAN PAPILLOMAVIRUS, HIGH RISK: HPV DNA High Risk: NOT DETECTED

## 2014-10-14 ENCOUNTER — Ambulatory Visit
Admission: RE | Admit: 2014-10-14 | Discharge: 2014-10-14 | Disposition: A | Discharge status: Home / Self Care | Payer: BLUE CROSS/BLUE SHIELD | Source: Ambulatory Visit | Attending: Physician Assistant | Admitting: Physician Assistant

## 2014-10-14 DIAGNOSIS — Z1231 Encounter for screening mammogram for malignant neoplasm of breast: Secondary | ICD-10-CM

## 2014-10-16 ENCOUNTER — Telehealth: Payer: Self-pay | Admitting: Physician Assistant

## 2014-10-16 NOTE — Telephone Encounter (Signed)
Relayed results. Pt is anemic and should take OTC iron supp. which can cause constipation so can use stool softener. Pap revealed ascus no hpv so will need repeat pap in 1 year. Pt agrees.

## 2015-06-13 ENCOUNTER — Other Ambulatory Visit (HOSPITAL_COMMUNITY): Payer: Self-pay | Admitting: Gastroenterology

## 2015-06-13 DIAGNOSIS — B181 Chronic viral hepatitis B without delta-agent: Secondary | ICD-10-CM

## 2015-06-18 ENCOUNTER — Ambulatory Visit (HOSPITAL_COMMUNITY)
Admission: RE | Admit: 2015-06-18 | Discharge: 2015-06-18 | Disposition: A | Discharge status: Home / Self Care | Payer: BLUE CROSS/BLUE SHIELD | Source: Ambulatory Visit | Attending: Gastroenterology | Admitting: Gastroenterology

## 2015-06-18 DIAGNOSIS — B181 Chronic viral hepatitis B without delta-agent: Secondary | ICD-10-CM

## 2015-06-18 DIAGNOSIS — Z9049 Acquired absence of other specified parts of digestive tract: Secondary | ICD-10-CM | POA: Diagnosis not present

## 2015-06-18 DIAGNOSIS — K769 Liver disease, unspecified: Secondary | ICD-10-CM | POA: Diagnosis not present

## 2015-06-18 DIAGNOSIS — N281 Cyst of kidney, acquired: Secondary | ICD-10-CM | POA: Diagnosis not present

## 2015-12-25 ENCOUNTER — Ambulatory Visit (INDEPENDENT_AMBULATORY_CARE_PROVIDER_SITE_OTHER): Payer: BLUE CROSS/BLUE SHIELD | Admitting: Physician Assistant

## 2015-12-25 VITALS — BP 114/74 | HR 78 | Temp 98.6°F | Resp 17 | Ht 60.5 in | Wt 96.0 lb

## 2015-12-25 DIAGNOSIS — K219 Gastro-esophageal reflux disease without esophagitis: Secondary | ICD-10-CM | POA: Diagnosis not present

## 2015-12-25 DIAGNOSIS — Z130 Encounter for screening for diseases of the blood and blood-forming organs and certain disorders involving the immune mechanism: Secondary | ICD-10-CM | POA: Diagnosis not present

## 2015-12-25 DIAGNOSIS — N920 Excessive and frequent menstruation with regular cycle: Secondary | ICD-10-CM | POA: Diagnosis not present

## 2015-12-25 DIAGNOSIS — Z1329 Encounter for screening for other suspected endocrine disorder: Secondary | ICD-10-CM

## 2015-12-25 DIAGNOSIS — Z23 Encounter for immunization: Secondary | ICD-10-CM | POA: Diagnosis not present

## 2015-12-25 DIAGNOSIS — Z0000 Encounter for general adult medical examination without abnormal findings: Secondary | ICD-10-CM

## 2015-12-25 DIAGNOSIS — Z114 Encounter for screening for human immunodeficiency virus [HIV]: Secondary | ICD-10-CM

## 2015-12-25 DIAGNOSIS — Z13220 Encounter for screening for lipoid disorders: Secondary | ICD-10-CM

## 2015-12-25 DIAGNOSIS — D709 Neutropenia, unspecified: Secondary | ICD-10-CM

## 2015-12-25 MED ORDER — ESOMEPRAZOLE MAGNESIUM 20 MG PO PACK: 20.0000 mg | PACK | Freq: Every day | ORAL | 2 refills | Status: DC

## 2015-12-25 NOTE — Patient Instructions (Addendum)
Please call and schedule your next mammogram.  You can pick-up your prescription for Nexium at your pharmacy. Try eating dinner a few hours earlier, maybe 7 pm. This will give your food time to digest before you go to bed at night.   Thank you for coming in today. I hope you feel we met your needs.  Feel free to call [facility] if you have any questions or further requests.  Please consider signing up for MyChart if you do not already have it, as this is a great way to communicate with me.  Best,  Mercer Pod, PA-C  Health Maintenance, Female Introduction Adopting a healthy lifestyle and getting preventive care can go a long way to promote health and wellness. Talk with your health care provider about what schedule of regular examinations is right for you. This is a good chance for you to check in with your provider about disease prevention and staying healthy. In between checkups, there are plenty of things you can do on your own. Experts have done a lot of research about which lifestyle changes and preventive measures are most likely to keep you healthy. Ask your health care provider for more information. Weight and diet Eat a healthy diet  Be sure to include plenty of vegetables, fruits, low-fat dairy products, and lean protein.  Do not eat a lot of foods high in solid fats, added sugars, or salt.  Get regular exercise. This is one of the most important things you can do for your health.  Most adults should exercise for at least 150 minutes each week. The exercise should increase your heart rate and make you sweat (moderate-intensity exercise).  Most adults should also do strengthening exercises at least twice a week. This is in addition to the moderate-intensity exercise. Maintain a healthy weight  Body mass index (BMI) is a measurement that can be used to identify possible weight problems. It estimates body fat based on height and weight. Your health care provider can help determine your  BMI and help you achieve or maintain a healthy weight.  For females 4 years of age and older:  A BMI below 18.5 is considered underweight.  A BMI of 18.5 to 24.9 is normal.  A BMI of 25 to 29.9 is considered overweight.  A BMI of 30 and above is considered obese. Watch levels of cholesterol and blood lipids  You should start having your blood tested for lipids and cholesterol at 47 years of age, then have this test every 5 years.  You may need to have your cholesterol levels checked more often if:  Your lipid or cholesterol levels are high.  You are older than 47 years of age.  You are at high risk for heart disease. Cancer screening Lung Cancer  Lung cancer screening is recommended for adults 20-20 years old who are at high risk for lung cancer because of a history of smoking.  A yearly low-dose CT scan of the lungs is recommended for people who:  Currently smoke.  Have quit within the past 15 years.  Have at least a 30-pack-year history of smoking. A pack year is smoking an average of one pack of cigarettes a day for 1 year.  Yearly screening should continue until it has been 15 years since you quit.  Yearly screening should stop if you develop a health problem that would prevent you from having lung cancer treatment. Breast Cancer  Practice breast self-awareness. This means understanding how your breasts normally appear and  feel.  It also means doing regular breast self-exams. Let your health care provider know about any changes, no matter how small.  If you are in your 20s or 30s, you should have a clinical breast exam (CBE) by a health care provider every 1-3 years as part of a regular health exam.  If you are 20 or older, have a CBE every year. Also consider having a breast X-ray (mammogram) every year.  If you have a family history of breast cancer, talk to your health care provider about genetic screening.  If you are at high risk for breast cancer, talk to  your health care provider about having an MRI and a mammogram every year.  Breast cancer gene (BRCA) assessment is recommended for women who have family members with BRCA-related cancers. BRCA-related cancers include:  Breast.  Ovarian.  Tubal.  Peritoneal cancers.  Results of the assessment will determine the need for genetic counseling and BRCA1 and BRCA2 testing. Cervical Cancer  Your health care provider may recommend that you be screened regularly for cancer of the pelvic organs (ovaries, uterus, and vagina). This screening involves a pelvic examination, including checking for microscopic changes to the surface of your cervix (Pap test). You may be encouraged to have this screening done every 3 years, beginning at age 70.  For women ages 65-65, health care providers may recommend pelvic exams and Pap testing every 3 years, or they may recommend the Pap and pelvic exam, combined with testing for human papilloma virus (HPV), every 5 years. Some types of HPV increase your risk of cervical cancer. Testing for HPV may also be done on women of any age with unclear Pap test results.  Other health care providers may not recommend any screening for nonpregnant women who are considered low risk for pelvic cancer and who do not have symptoms. Ask your health care provider if a screening pelvic exam is right for you.  If you have had past treatment for cervical cancer or a condition that could lead to cancer, you need Pap tests and screening for cancer for at least 20 years after your treatment. If Pap tests have been discontinued, your risk factors (such as having a new sexual partner) need to be reassessed to determine if screening should resume. Some women have medical problems that increase the chance of getting cervical cancer. In these cases, your health care provider may recommend more frequent screening and Pap tests. Colorectal Cancer  This type of cancer can be detected and often  prevented.  Routine colorectal cancer screening usually begins at 47 years of age and continues through 47 years of age.  Your health care provider may recommend screening at an earlier age if you have risk factors for colon cancer.  Your health care provider may also recommend using home test kits to check for hidden blood in the stool.  A small camera at the end of a tube can be used to examine your colon directly (sigmoidoscopy or colonoscopy). This is done to check for the earliest forms of colorectal cancer.  Routine screening usually begins at age 22.  Direct examination of the colon should be repeated every 5-10 years through 47 years of age. However, you may need to be screened more often if early forms of precancerous polyps or small growths are found. Skin Cancer  Check your skin from head to toe regularly.  Tell your health care provider about any new moles or changes in moles, especially if there is a  change in a mole's shape or color.  Also tell your health care provider if you have a mole that is larger than the size of a pencil eraser.  Always use sunscreen. Apply sunscreen liberally and repeatedly throughout the day.  Protect yourself by wearing long sleeves, pants, a wide-brimmed hat, and sunglasses whenever you are outside. Heart disease, diabetes, and high blood pressure  High blood pressure causes heart disease and increases the risk of stroke. High blood pressure is more likely to develop in:  People who have blood pressure in the high end of the normal range (130-139/85-89 mm Hg).  People who are overweight or obese.  People who are African American.  If you are 78-68 years of age, have your blood pressure checked every 3-5 years. If you are 34 years of age or older, have your blood pressure checked every year. You should have your blood pressure measured twice-once when you are at a hospital or clinic, and once when you are not at a hospital or clinic. Record  the average of the two measurements. To check your blood pressure when you are not at a hospital or clinic, you can use:  An automated blood pressure machine at a pharmacy.  A home blood pressure monitor.  If you are between 45 years and 81 years old, ask your health care provider if you should take aspirin to prevent strokes.  Have regular diabetes screenings. This involves taking a blood sample to check your fasting blood sugar level.  If you are at a normal weight and have a low risk for diabetes, have this test once every three years after 47 years of age.  If you are overweight and have a high risk for diabetes, consider being tested at a younger age or more often. Preventing infection Hepatitis B  If you have a higher risk for hepatitis B, you should be screened for this virus. You are considered at high risk for hepatitis B if:  You were born in a country where hepatitis B is common. Ask your health care provider which countries are considered high risk.  Your parents were born in a high-risk country, and you have not been immunized against hepatitis B (hepatitis B vaccine).  You have HIV or AIDS.  You use needles to inject street drugs.  You live with someone who has hepatitis B.  You have had sex with someone who has hepatitis B.  You get hemodialysis treatment.  You take certain medicines for conditions, including cancer, organ transplantation, and autoimmune conditions. Hepatitis C  Blood testing is recommended for:  Everyone born from [birth year] through 1967.  Anyone with known risk factors for hepatitis C. Sexually transmitted infections (STIs)  You should be screened for sexually transmitted infections (STIs) including gonorrhea and chlamydia if:  You are sexually active and are younger than 47 years of age.  You are older than 47 years of age and your health care provider tells you that you are at risk for this type of infection.  Your sexual activity has  changed since you were last screened and you are at an increased risk for chlamydia or gonorrhea. Ask your health care provider if you are at risk.  If you do not have HIV, but are at risk, it may be recommended that you take a prescription medicine daily to prevent HIV infection. This is called pre-exposure prophylaxis (PrEP). You are considered at risk if:  You are sexually active and do not regularly use condoms or  know the HIV status of your partner(s).  You take drugs by injection.  You are sexually active with a partner who has HIV. Talk with your health care provider about whether you are at high risk of being infected with HIV. If you choose to begin PrEP, you should first be tested for HIV. You should then be tested every 3 months for as long as you are taking PrEP. Pregnancy  If you are premenopausal and you may become pregnant, ask your health care provider about preconception counseling.  If you may become pregnant, take 400 to 800 micrograms (mcg) of folic acid every day.  If you want to prevent pregnancy, talk to your health care provider about birth control (contraception). Osteoporosis and menopause  Osteoporosis is a disease in which the bones lose minerals and strength with aging. This can result in serious bone fractures. Your risk for osteoporosis can be identified using a bone density scan.  If you are 60 years of age or older, or if you are at risk for osteoporosis and fractures, ask your health care provider if you should be screened.  Ask your health care provider whether you should take a calcium or vitamin D supplement to lower your risk for osteoporosis.  Menopause may have certain physical symptoms and risks.  Hormone replacement therapy may reduce some of these symptoms and risks. Talk to your health care provider about whether hormone replacement therapy is right for you. Follow these instructions at home:  Schedule regular health, dental, and eye  exams.  Stay current with your immunizations.  Do not use any tobacco products including cigarettes, chewing tobacco, or electronic cigarettes.  If you are pregnant, do not drink alcohol.  If you are breastfeeding, limit how much and how often you drink alcohol.  Limit alcohol intake to no more than 1 drink per day for nonpregnant women. One drink equals 12 ounces of beer, 5 ounces of wine, or 1 ounces of hard liquor.  Do not use street drugs.  Do not share needles.  Ask your health care provider for help if you need support or information about quitting drugs.  Tell your health care provider if you often feel depressed.  Tell your health care provider if you have ever been abused or do not feel safe at home. This information is not intended to replace advice given to you by your health care provider. Make sure you discuss any questions you have with your health care provider. Document Released: 07/13/2010 Document Revised: 06/05/2015 Document Reviewed: 10/01/2014  2017 Elsevier   IF you received an x-ray today, you will receive an invoice from [facility]. Please contact [facility] at [phone] with questions or concerns regarding your invoice.   IF you received labwork today, you will receive an invoice from [employer]. Please contact [facility] at [phone] with questions or concerns regarding your invoice.   Our billing staff will not be able to assist you with questions regarding bills from these companies.  You will be contacted with the lab results as soon as they are available. The fastest way to get your results is to activate your My Chart account. Instructions are located on the last page of this paperwork. If you have not heard from [place] regarding the results in 2 weeks, please contact this office.

## 2015-12-25 NOTE — Progress Notes (Signed)
[facility] [address], [place] [address] [phone]  Date:  12/25/2015   Name:  [name]   DOB:  14-Mar-1970   MRN:  [id number]  PCP:  Kathreen Cornfield, MD    Chief Complaint: Annual Exam (PT ENCOURAGED TO FILL OUT PAPERWORK. PT GIVEN GOWN. URINE COLLECTED )   History of Present Illness:  This is a pleasant 45 y.o. female with PMH hepatitis B, thyroid nodules who is presenting for CPE. She works at [employer].   She is from [place]. She immigrated in [birth year] when she was 45 years old. Her adopted mother passed away 30 years ago. No other family. She has three children.   History of Hepatitis B - Sees Dr. Davene Costain every three months. Takes Viread daily.   Complaints: Abdominal pain. Located at her upper, middle belly, feels sour foods coming up. Wakes her up at three am. She stopped drinking soda and reduced spicy foods. She eats dinner at 8 or 9 pm. Has taken Nexium in the past, provided her with relief. Would like refill today.   History of iron deficiency anemia - Taking vitamin B12.   LMP: Last month. Her periods last two weeks. This is new, she first noticed it three months ago. Dr. Domingo Dimes on [facility]. is her PCP. She plans to make appt with her for this. Also c/o hot sweats in the morning. Contraception: None.  Last pap: 09/2014 - ASCUS with negative HPV. Recommend next PAP in 2019 Sexual history: Not sexually active. She has a female friend x 11 years. He does not work and she supports him. Not romantically involved. He helps her with the children, she says it is like "brother, sister" Immunizations: Needs Tdap, flu shot Dentist: Needs appt. She does not have the money right now.  Eye: Wears glasses.  Diet/Exercise: She is [place], rice, beef fried rice. Not a lot of hamburgers or cheese. She drinks a lot of water. She notes she eats a lot of ice. Her doctors at [facility] say this is due to low iron. She is taking vitamin B-12. No  exercise.  Fam hx:  Unknown. She is adopted. Her adopted mother died when patient was 8 years old.  Tobacco/alcohol/substance use: None.   Mammogram: Last mammogram 2016. She will schedule mammogram soon.   Review of Systems:  Review of Systems  Constitutional: Negative for chills, diaphoresis, fatigue, fever and unexpected weight change.  HENT: Negative for congestion, postnasal drip, rhinorrhea, sinus pressure, sneezing and sore throat.   Respiratory: Negative for cough, chest tightness, shortness of breath and wheezing.   Cardiovascular: Negative for chest pain and palpitations.  Gastrointestinal: Negative for abdominal pain, diarrhea, nausea and vomiting.  Endocrine: Negative.   Genitourinary: Positive for menstrual problem. Negative for pelvic pain, vaginal bleeding, vaginal discharge and vaginal pain.  Neurological: Negative for weakness, light-headedness and headaches.  Psychiatric/Behavioral: Negative for sleep disturbance. The patient is not nervous/anxious.     Patient Active Problem List   Diagnosis Date Noted  . Multiple thyroid nodules 07/26/2014    Prior to Admission medications   Medication Sig Start Date End Date Taking? Authorizing Provider  esomeprazole (NEXIUM) 40 MG capsule Take 40 mg by mouth daily at 12 noon.   Yes Historical Provider, MD  ferrous sulfate 325 (65 FE) MG tablet Take 325 mg by mouth daily with breakfast.   Yes Historical Provider, MD  Multiple Vitamins-Minerals (WOMENS MULTIVITAMIN PLUS PO) Take 1 capsule by  mouth daily. Women's Ultra Mega [employer] Metabolism   Yes Historical Provider, MD  Naproxen Sodium (ALEVE PO) Take by mouth as needed.   Yes Historical Provider, MD  OXYBUTYNIN CHLORIDE ER PO Take 1 tablet by mouth.   Yes Historical Provider, MD  Pediatric Multivit-Minerals-C (CHEWABLES MULTIVITAMIN PO) Take 1 each by mouth daily.   Yes Historical Provider, MD  tenofovir (VIREAD) 300 MG tablet Take 300 mg by mouth daily.   Yes Historical  Provider, MD    Allergies  Allergen Reactions  . Amoxil [Amoxicillin] Rash  . Metronidazole Rash  . Pantoprazole Rash  . Prevacid [Lansoprazole] Rash    Past Surgical History:  Procedure Laterality Date  . CHOLECYSTECTOMY    . ESOPHAGOGASTRODUODENOSCOPY  09/2002  . kidney stones    . LIVER BIOPSY  08/2002    Social History  Substance Use Topics  . Smoking status: Never Smoker  . Smokeless tobacco: Not on file  . Alcohol use No    No family history on file.  Medication list has been reviewed and updated.  Physical Examination:  Physical Exam  Constitutional: She is oriented to person, place, and time. She appears well-developed and well-nourished. No distress.  HENT:  Head: Normocephalic and atraumatic.  Mouth/Throat: Oropharynx is clear and moist.  Eyes: Conjunctivae and EOM are normal. Pupils are equal, round, and reactive to light.  Neck: Normal range of motion. Neck supple. No thyromegaly present.  Cardiovascular: Normal rate, regular rhythm and normal heart sounds.   No murmur heard. Pulmonary/Chest: Effort normal and breath sounds normal. She has no wheezes. Right breast exhibits no mass and no skin change. Left breast exhibits no mass and no skin change.  Abdominal: Soft. Bowel sounds are normal. She exhibits no mass. There is no tenderness.  Musculoskeletal: Normal range of motion.  Neurological: She is alert and oriented to person, place, and time. She has normal reflexes.  Skin: Skin is warm and dry.  Psychiatric: She has a normal mood and affect. Her behavior is normal. Judgment and thought content normal.  Vitals reviewed.   BP 114/74 (BP Location: Right Arm, Patient Position: Sitting, Cuff Size: Normal)   Pulse 78   Temp 98.6 F (37 C) (Oral)   Resp 17   Ht 5' 0.5" (1.537 m)   Wt 96 lb (43.5 kg)   LMP 11/25/2015 (Approximate)   SpO2 98%   BMI 18.44 kg/m   Assessment and Plan: 1. Annual physical exam - CMP14+EGFR - Discussed anticipatory  guidance. Start exercising. RTC in one year for annual exam.   2. Screening, lipid - Lipid panel  3. Encounter for screening for HIV - HIV antibody  4. Needs flu shot - Flu Vaccine QUAD 36+ mos IM  5. Need for TD vaccine - Td vaccine greater than or equal to 7yo preservative free IM  6. Thyroid disorder screening - TSH  7. Screening for deficiency anemia - CBC  8. Menorrhagia with regular cycle - F/u with PCP. RTC if you cannot get appt.    Mercer Pod, PA-C  [facility] 12/25/2015 8:12 AM

## 2015-12-26 ENCOUNTER — Encounter: Payer: Self-pay | Admitting: Physician Assistant

## 2015-12-26 ENCOUNTER — Telehealth: Payer: Self-pay

## 2015-12-26 LAB — CMP14+EGFR
ALT: 23 IU/L (ref 0–32)
AST: 28 IU/L (ref 0–40)
Albumin/Globulin Ratio: 1.3 (ref 1.2–2.2)
Albumin: 4.3 g/dL (ref 3.5–5.5)
Alkaline Phosphatase: 45 IU/L (ref 39–117)
BUN/Creatinine Ratio: 18 (ref 9–23)
BUN: 14 mg/dL (ref 6–24)
Bilirubin Total: 0.5 mg/dL (ref 0.0–1.2)
CO2: 25 mmol/L (ref 18–29)
Calcium: 9.2 mg/dL (ref 8.7–10.2)
Chloride: 102 mmol/L (ref 96–106)
Creatinine, Ser: 0.8 mg/dL (ref 0.57–1.00)
GFR calc Af Amer: 102 mL/min/{1.73_m2} (ref >59)
GFR calc non Af Amer: 88 mL/min/{1.73_m2} (ref >59)
Globulin, Total: 3.3 g/dL (ref 1.5–4.5)
Glucose: 77 mg/dL (ref 65–99)
Potassium: 5 mmol/L (ref 3.5–5.2)
Sodium: 142 mmol/L (ref 134–144)
Total Protein: 7.6 g/dL (ref 6.0–8.5)

## 2015-12-26 LAB — LIPID PANEL
Chol/HDL Ratio: 2.5 ratio units (ref 0.0–4.4)
Cholesterol, Total: 164 mg/dL (ref 100–199)
HDL: 65 mg/dL (ref >39)
LDL Calculated: 90 mg/dL (ref 0–99)
Triglycerides: 45 mg/dL (ref 0–149)
VLDL Cholesterol Cal: 9 mg/dL (ref 5–40)

## 2015-12-26 LAB — HIV ANTIBODY (ROUTINE TESTING W REFLEX): HIV Screen 4th Generation wRfx: NONREACTIVE

## 2015-12-26 LAB — CBC
Hematocrit: 36.7 % (ref 34.0–46.6)
Hemoglobin: 11.4 g/dL (ref 11.1–15.9)
MCH: 25.8 pg — ABNORMAL LOW (ref 26.6–33.0)
MCHC: 31.1 g/dL — ABNORMAL LOW (ref 31.5–35.7)
MCV: 83 fL (ref 79–97)
Platelets: 113 10*3/uL — ABNORMAL LOW (ref 150–379)
RBC: 4.42 x10E6/uL (ref 3.77–5.28)
RDW: 17.4 % — ABNORMAL HIGH (ref 12.3–15.4)
WBC: 2.6 10*3/uL — ABNORMAL LOW (ref 3.4–10.8)

## 2015-12-26 LAB — TSH: TSH: 1.39 u[IU]/mL (ref 0.450–4.500)

## 2015-12-26 NOTE — Telephone Encounter (Signed)
My fault. I meant to Rx capsules. I did not intend to do this. Can you switch it or do I need to make the change?

## 2015-12-26 NOTE — Telephone Encounter (Signed)
Whitney, you Rxd nexium packets for pt and this requires a PA. I see that pt had been taking the capsules. Did you intend to change her to the powder/packets, and if so, what is the reason, so that I can complete the PA. Does pt have trouble swallowing caps, etc?

## 2016-01-06 MED ORDER — ESOMEPRAZOLE MAGNESIUM 20 MG PO CPDR: DELAYED_RELEASE_CAPSULE | ORAL | 2 refills | Status: AC

## 2016-01-06 NOTE — Telephone Encounter (Signed)
Sent in new Rx for the caps and asked pharm to fill instead of Rx prev sent for packets.

## 2016-01-10 ENCOUNTER — Encounter: Payer: Self-pay | Admitting: Physician Assistant

## 2016-01-10 NOTE — Addendum Note (Signed)
Addended by: Dorise Hiss on: 01/10/2016 08:14 AM   Modules accepted: Orders

## 2016-01-10 NOTE — Progress Notes (Signed)
Please call patient and have her come back to the clinic in about 3 months for LABS ONLY. I would like to follow her white blood cell count. Thank you.

## 2016-01-10 NOTE — Progress Notes (Signed)
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  Will follow. Pt RTC in a few months for lab only visit.

## 2016-01-13 ENCOUNTER — Ambulatory Visit (INDEPENDENT_AMBULATORY_CARE_PROVIDER_SITE_OTHER): Payer: BLUE CROSS/BLUE SHIELD | Admitting: Physician Assistant

## 2016-01-13 VITALS — BP 120/78 | HR 80 | Temp 97.8°F | Resp 18 | Ht 60.5 in | Wt 97.0 lb

## 2016-01-13 DIAGNOSIS — R05 Cough: Secondary | ICD-10-CM | POA: Diagnosis not present

## 2016-01-13 DIAGNOSIS — R0981 Nasal congestion: Secondary | ICD-10-CM | POA: Diagnosis not present

## 2016-01-13 DIAGNOSIS — R059 Cough, unspecified: Secondary | ICD-10-CM

## 2016-01-13 DIAGNOSIS — J069 Acute upper respiratory infection, unspecified: Secondary | ICD-10-CM | POA: Diagnosis not present

## 2016-01-13 MED ORDER — CETIRIZINE HCL 10 MG PO TABS: 10.0000 mg | ORAL_TABLET | Freq: Every day | ORAL | 0 refills | Status: AC

## 2016-01-13 MED ORDER — HYDROCOD POLST-CPM POLST ER 10-8 MG/5ML PO SUER: 5.0000 mL | Freq: Two times a day (BID) | ORAL | 0 refills | Status: AC | PRN

## 2016-01-13 MED ORDER — FLUTICASONE PROPIONATE 50 MCG/ACT NA SUSP: 2.0000 | Freq: Every day | NASAL | 0 refills | Status: AC

## 2016-01-13 MED ORDER — BENZONATATE 100 MG PO CAPS
100.0000 mg | ORAL_CAPSULE | Freq: Three times a day (TID) | ORAL | 0 refills | Status: AC | PRN
Start: 2016-01-13 — End: ?

## 2016-01-13 NOTE — Progress Notes (Signed)
MRN: [id number] DOB: 02/26/1970  Subjective:   [name] is a 46 y.o. female presenting for chief complaint of Cough (x 4 days); Sinusitis (x 4days ); Chest Pain (congestion x 4days ); and Dizziness .  Reports 5 day history of sinus headache, sinus congestion, ear pain, productive cough (no hemoptysis), chest tightness, and chills. Has tried aleve and mucinex with moderate relief. Notes she has gotten better but the symptoms are not completely gone. Denies fever, wheezing, shortness of breath and myalgia, nausea, vomiting, abdominal pain and diarrhea. Has had sick contact with daughter who has a cough. No history of seasonal allergies or asthma. Patient has had flu shot this season. Denies smoking and alcohol use.  Denies any other aggravating or relieving factors, no other questions or concerns.  [name] has a current medication list which includes the following prescription(s): esomeprazole, ferrous sulfate, multiple vitamins-minerals, naproxen sodium, oxybutynin chloride, pediatric multivit-minerals-c, and tenofovir. Also is allergic to amoxil [amoxicillin]; metronidazole; pantoprazole; and prevacid [lansoprazole].  [name]  has a past medical history of Allergic rhinitis; Anxiety; Arthritis; Back pain; Chronic hepatitis B ([place]); Gastritis; GERD (gastroesophageal reflux disease); Hepatitis B; Hypertension; Migraine headache; and Weight loss. Also  has a past surgical history that includes kidney stones; Cholecystectomy; Liver biopsy (08/2002); and Esophagogastroduodenoscopy (09/2002).   Objective:   Vitals: BP 120/78 (BP Location: Right Arm, Patient Position: Sitting, Cuff Size: Normal)   Pulse 80   Temp 97.8 F (36.6 C) (Oral)   Resp 18   Ht 5' 0.5" (1.537 m)   Wt 97 lb (44 kg)   LMP 12/21/2015   SpO2 100%   BMI 18.63 kg/m   Physical Exam  Constitutional: She is oriented to person, place, and time. She appears well-developed and well-nourished. She is active. She does not have a sickly  appearance. No distress ( very talkative during exam).  HENT:  Head: Normocephalic and atraumatic.  Right Ear: Tympanic membrane, external ear and ear canal normal.  Left Ear: Tympanic membrane, external ear and ear canal normal.  Nose: Mucosal edema present. Right sinus exhibits no maxillary sinus tenderness and no frontal sinus tenderness. Left sinus exhibits no maxillary sinus tenderness and no frontal sinus tenderness.  Mouth/Throat: Uvula is midline and mucous membranes are normal. No tonsillar exudate.  Eyes: Conjunctivae are normal.  Neck: Normal range of motion.  Cardiovascular: Normal rate, regular rhythm and normal heart sounds.   Pulmonary/Chest: Effort normal.  Lymphadenopathy:       Head (right side): No submental, no submandibular, no tonsillar, no preauricular, no posterior auricular and no occipital adenopathy present.       Head (left side): No submental, no submandibular, no tonsillar, no preauricular, no posterior auricular and no occipital adenopathy present.    She has no cervical adenopathy.       Right: No supraclavicular adenopathy present.       Left: No supraclavicular adenopathy present.  Neurological: She is alert and oriented to person, place, and time.  Skin: Skin is warm and dry.  Psychiatric: She has a normal mood and affect.  Vitals reviewed.  No results found for this or any previous visit (from the past 24 hour(s)).  Assessment and Plan :  1. Acute upper respiratory infection Likely viral etiology, will treat symptomatically. Pt given educational instructions on treatment for viral upper respiratory infection. Instructed to return to clinic if symptoms worsen, do not improve in 5-7 days, or as needed. Pt understands and agrees to treatment plan.   2. Cough -  benzonatate (TESSALON) 100 MG capsule; Take 1-2 capsules (100-200 mg total) by mouth 3 (three) times daily as needed for cough.  Dispense: 40 capsule; Refill: 0 - chlorpheniramine-HYDROcodone  (TUSSIONEX PENNKINETIC ER) 10-8 MG/5ML SUER; Take 5 mLs by mouth every 12 (twelve) hours as needed for cough.  Dispense: 100 mL; Refill: 0  3. Nasal congestion - fluticasone (FLONASE) 50 MCG/ACT nasal spray; Place 2 sprays into both nostrils daily.  Dispense: 16 g; Refill: 0 - cetirizine (ZYRTEC) 10 MG tablet; Take 1 tablet (10 mg total) by mouth daily.  Dispense: 30 tablet; Refill: 0  Tenna Delaine, PA-C  [facility] 01/13/2016 11:31 AM

## 2016-01-13 NOTE — Patient Instructions (Addendum)
-   We will treat this as a respiratory viral infection.  - I recommend you rest, drink plenty of fluids, eat light meals including soups. Drink warm tea with honey, lemon, and ginger root.  - You may use flonase, zyrtec, and OTC sudafed for nasal congestion.  - You may use cough syrup at night for your cough and sore throat, Tessalon pearls during the day. Be aware that cough syrup can definitely make you drowsy and sleepy so do not drive or operate any heavy machinery if it is affecting you during the day.  - You may also use Tylenol or ibuprofen over-the-counter for your sore throat.  - Please let me know if you are not seeing any improvement or get worse in 5-7 days.   IF you received an x-ray today, you will receive an invoice from [facility]. Please contact [facility] at [phone] with questions or concerns regarding your invoice.   IF you received labwork today, you will receive an invoice from [facility]. Please contact [facility] at [phone] with questions or concerns regarding your invoice.   Our billing staff will not be able to assist you with questions regarding bills from these companies.  You will be contacted with the lab results as soon as they are available. The fastest way to get your results is to activate your My Chart account. Instructions are located on the last page of this paperwork. If you have not heard from [place] regarding the results in 2 weeks, please contact this office.

## 2016-10-21 ENCOUNTER — Other Ambulatory Visit (HOSPITAL_COMMUNITY): Payer: Self-pay | Admitting: Nurse Practitioner

## 2016-10-21 DIAGNOSIS — B181 Chronic viral hepatitis B without delta-agent: Secondary | ICD-10-CM

## 2016-11-12 ENCOUNTER — Ambulatory Visit (HOSPITAL_COMMUNITY): Payer: BLUE CROSS/BLUE SHIELD

## 2017-05-18 DIAGNOSIS — D509 Iron deficiency anemia, unspecified: Secondary | ICD-10-CM | POA: Diagnosis not present

## 2017-09-13 DIAGNOSIS — R110 Nausea: Secondary | ICD-10-CM | POA: Diagnosis not present

## 2017-09-13 DIAGNOSIS — M5416 Radiculopathy, lumbar region: Secondary | ICD-10-CM | POA: Diagnosis not present

## 2017-09-13 DIAGNOSIS — B181 Chronic viral hepatitis B without delta-agent: Secondary | ICD-10-CM | POA: Diagnosis not present

## 2017-09-13 DIAGNOSIS — E039 Hypothyroidism, unspecified: Secondary | ICD-10-CM | POA: Diagnosis not present

## 2017-09-13 DIAGNOSIS — Z9049 Acquired absence of other specified parts of digestive tract: Secondary | ICD-10-CM | POA: Diagnosis not present

## 2017-09-13 DIAGNOSIS — Z5181 Encounter for therapeutic drug level monitoring: Secondary | ICD-10-CM | POA: Diagnosis not present

## 2017-09-13 DIAGNOSIS — D1803 Hemangioma of intra-abdominal structures: Secondary | ICD-10-CM | POA: Diagnosis not present

## 2017-09-13 DIAGNOSIS — Z681 Body mass index (BMI) 19 or less, adult: Secondary | ICD-10-CM | POA: Diagnosis not present

## 2017-09-13 DIAGNOSIS — Z1289 Encounter for screening for malignant neoplasm of other sites: Secondary | ICD-10-CM | POA: Diagnosis not present

## 2017-09-13 DIAGNOSIS — Z880 Allergy status to penicillin: Secondary | ICD-10-CM | POA: Diagnosis not present

## 2018-03-02 ENCOUNTER — Other Ambulatory Visit: Payer: Self-pay | Admitting: Family

## 2018-03-02 DIAGNOSIS — Z5181 Encounter for therapeutic drug level monitoring: Secondary | ICD-10-CM | POA: Diagnosis not present

## 2018-03-02 DIAGNOSIS — E039 Hypothyroidism, unspecified: Secondary | ICD-10-CM | POA: Diagnosis not present

## 2018-03-02 DIAGNOSIS — M5416 Radiculopathy, lumbar region: Secondary | ICD-10-CM | POA: Diagnosis not present

## 2018-03-02 DIAGNOSIS — Z9049 Acquired absence of other specified parts of digestive tract: Secondary | ICD-10-CM | POA: Diagnosis not present

## 2018-03-02 DIAGNOSIS — E041 Nontoxic single thyroid nodule: Secondary | ICD-10-CM | POA: Diagnosis not present

## 2018-03-02 DIAGNOSIS — Z880 Allergy status to penicillin: Secondary | ICD-10-CM | POA: Diagnosis not present

## 2018-03-02 DIAGNOSIS — D509 Iron deficiency anemia, unspecified: Secondary | ICD-10-CM | POA: Diagnosis not present

## 2018-03-02 DIAGNOSIS — B181 Chronic viral hepatitis B without delta-agent: Secondary | ICD-10-CM | POA: Diagnosis not present

## 2018-03-02 DIAGNOSIS — Z681 Body mass index (BMI) 19 or less, adult: Secondary | ICD-10-CM | POA: Diagnosis not present

## 2018-03-02 DIAGNOSIS — R110 Nausea: Secondary | ICD-10-CM | POA: Diagnosis not present

## 2018-03-02 DIAGNOSIS — K219 Gastro-esophageal reflux disease without esophagitis: Secondary | ICD-10-CM | POA: Diagnosis not present

## 2018-03-31 ENCOUNTER — Ambulatory Visit
Admission: RE | Admit: 2018-03-31 | Discharge: 2018-03-31 | Disposition: A | Discharge status: Home / Self Care | Payer: BLUE CROSS/BLUE SHIELD | Source: Ambulatory Visit | Attending: Family | Admitting: Family

## 2018-03-31 ENCOUNTER — Other Ambulatory Visit: Payer: Self-pay

## 2018-03-31 DIAGNOSIS — B181 Chronic viral hepatitis B without delta-agent: Secondary | ICD-10-CM | POA: Diagnosis not present

## 2018-06-16 ENCOUNTER — Other Ambulatory Visit (HOSPITAL_COMMUNITY)
Admission: RE | Admit: 2018-06-16 | Discharge: 2018-06-16 | Disposition: A | Discharge status: Home / Self Care | Payer: BC Managed Care – PPO | Source: Ambulatory Visit | Attending: Physician Assistant | Admitting: Physician Assistant

## 2018-06-16 ENCOUNTER — Other Ambulatory Visit: Payer: Self-pay | Admitting: Physician Assistant

## 2018-06-16 DIAGNOSIS — Z0000 Encounter for general adult medical examination without abnormal findings: Secondary | ICD-10-CM | POA: Insufficient documentation

## 2018-06-16 DIAGNOSIS — Z13220 Encounter for screening for lipoid disorders: Secondary | ICD-10-CM | POA: Diagnosis not present

## 2018-06-16 DIAGNOSIS — L910 Hypertrophic scar: Secondary | ICD-10-CM | POA: Diagnosis not present

## 2018-06-16 DIAGNOSIS — D509 Iron deficiency anemia, unspecified: Secondary | ICD-10-CM | POA: Diagnosis not present

## 2018-06-16 DIAGNOSIS — B181 Chronic viral hepatitis B without delta-agent: Secondary | ICD-10-CM | POA: Diagnosis not present

## 2018-06-20 LAB — CYTOLOGY - PAP
Diagnosis: NEGATIVE
HPV: NOT DETECTED

## 2018-08-09 DIAGNOSIS — B181 Chronic viral hepatitis B without delta-agent: Secondary | ICD-10-CM | POA: Diagnosis not present

## 2018-08-10 DIAGNOSIS — B181 Chronic viral hepatitis B without delta-agent: Secondary | ICD-10-CM | POA: Diagnosis not present

## 2018-08-14 ENCOUNTER — Other Ambulatory Visit: Payer: Self-pay | Admitting: Gastroenterology

## 2018-08-14 DIAGNOSIS — B181 Chronic viral hepatitis B without delta-agent: Secondary | ICD-10-CM

## 2018-08-28 ENCOUNTER — Ambulatory Visit
Admission: RE | Admit: 2018-08-28 | Discharge: 2018-08-28 | Disposition: A | Discharge status: Home / Self Care | Payer: BC Managed Care – PPO | Source: Ambulatory Visit | Attending: Gastroenterology | Admitting: Gastroenterology

## 2018-08-28 DIAGNOSIS — B181 Chronic viral hepatitis B without delta-agent: Secondary | ICD-10-CM

## 2019-04-20 DIAGNOSIS — M25511 Pain in right shoulder: Secondary | ICD-10-CM | POA: Diagnosis not present

## 2019-04-20 DIAGNOSIS — Z1211 Encounter for screening for malignant neoplasm of colon: Secondary | ICD-10-CM | POA: Diagnosis not present

## 2019-04-20 DIAGNOSIS — B181 Chronic viral hepatitis B without delta-agent: Secondary | ICD-10-CM | POA: Diagnosis not present

## 2019-04-20 DIAGNOSIS — M7711 Lateral epicondylitis, right elbow: Secondary | ICD-10-CM | POA: Diagnosis not present

## 2019-05-08 ENCOUNTER — Other Ambulatory Visit: Payer: Self-pay | Admitting: Gastroenterology

## 2019-05-08 DIAGNOSIS — B181 Chronic viral hepatitis B without delta-agent: Secondary | ICD-10-CM

## 2019-05-21 ENCOUNTER — Ambulatory Visit
Admission: RE | Admit: 2019-05-21 | Discharge: 2019-05-21 | Disposition: A | Discharge status: Home / Self Care | Payer: BC Managed Care – PPO | Source: Ambulatory Visit | Attending: Gastroenterology | Admitting: Gastroenterology

## 2019-05-21 DIAGNOSIS — B181 Chronic viral hepatitis B without delta-agent: Secondary | ICD-10-CM | POA: Diagnosis not present

## 2019-05-22 ENCOUNTER — Other Ambulatory Visit: Payer: Self-pay | Admitting: Gastroenterology

## 2019-05-22 DIAGNOSIS — R9389 Abnormal findings on diagnostic imaging of other specified body structures: Secondary | ICD-10-CM

## 2019-05-22 DIAGNOSIS — N2889 Other specified disorders of kidney and ureter: Secondary | ICD-10-CM

## 2019-06-01 DIAGNOSIS — Z1159 Encounter for screening for other viral diseases: Secondary | ICD-10-CM | POA: Diagnosis not present

## 2019-06-06 DIAGNOSIS — K648 Other hemorrhoids: Secondary | ICD-10-CM | POA: Diagnosis not present

## 2019-06-06 DIAGNOSIS — K635 Polyp of colon: Secondary | ICD-10-CM | POA: Diagnosis not present

## 2019-06-06 DIAGNOSIS — Z1211 Encounter for screening for malignant neoplasm of colon: Secondary | ICD-10-CM | POA: Diagnosis not present

## 2019-06-08 DIAGNOSIS — M7711 Lateral epicondylitis, right elbow: Secondary | ICD-10-CM | POA: Diagnosis not present

## 2019-06-21 DIAGNOSIS — M62838 Other muscle spasm: Secondary | ICD-10-CM | POA: Diagnosis not present

## 2019-06-21 DIAGNOSIS — M7711 Lateral epicondylitis, right elbow: Secondary | ICD-10-CM | POA: Diagnosis not present

## 2019-06-21 DIAGNOSIS — M6289 Other specified disorders of muscle: Secondary | ICD-10-CM | POA: Diagnosis not present

## 2019-06-26 DIAGNOSIS — M6289 Other specified disorders of muscle: Secondary | ICD-10-CM | POA: Diagnosis not present

## 2019-06-26 DIAGNOSIS — M62838 Other muscle spasm: Secondary | ICD-10-CM | POA: Diagnosis not present

## 2019-06-26 DIAGNOSIS — M7711 Lateral epicondylitis, right elbow: Secondary | ICD-10-CM | POA: Diagnosis not present

## 2019-07-02 ENCOUNTER — Other Ambulatory Visit: Payer: Self-pay

## 2019-07-02 ENCOUNTER — Ambulatory Visit
Admission: RE | Admit: 2019-07-02 | Discharge: 2019-07-02 | Disposition: A | Discharge status: Home / Self Care | Payer: BC Managed Care – PPO | Source: Ambulatory Visit | Attending: Gastroenterology | Admitting: Gastroenterology

## 2019-07-02 DIAGNOSIS — N289 Disorder of kidney and ureter, unspecified: Secondary | ICD-10-CM | POA: Diagnosis not present

## 2019-07-02 DIAGNOSIS — R9389 Abnormal findings on diagnostic imaging of other specified body structures: Secondary | ICD-10-CM

## 2019-07-02 DIAGNOSIS — N2889 Other specified disorders of kidney and ureter: Secondary | ICD-10-CM

## 2019-07-02 DIAGNOSIS — D1803 Hemangioma of intra-abdominal structures: Secondary | ICD-10-CM | POA: Diagnosis not present

## 2019-07-02 MED ORDER — GADOBENATE DIMEGLUMINE 529 MG/ML IV SOLN
9.0000 mL | Freq: Once | INTRAVENOUS | Status: AC | PRN
  Administered 2019-07-02: 9 mL via INTRAVENOUS

## 2019-07-03 DIAGNOSIS — M62838 Other muscle spasm: Secondary | ICD-10-CM | POA: Diagnosis not present

## 2019-07-03 DIAGNOSIS — M7711 Lateral epicondylitis, right elbow: Secondary | ICD-10-CM | POA: Diagnosis not present

## 2019-07-03 DIAGNOSIS — M6289 Other specified disorders of muscle: Secondary | ICD-10-CM | POA: Diagnosis not present

## 2019-07-09 DIAGNOSIS — M6289 Other specified disorders of muscle: Secondary | ICD-10-CM | POA: Diagnosis not present

## 2019-07-09 DIAGNOSIS — M62838 Other muscle spasm: Secondary | ICD-10-CM | POA: Diagnosis not present

## 2019-07-09 DIAGNOSIS — M7711 Lateral epicondylitis, right elbow: Secondary | ICD-10-CM | POA: Diagnosis not present

## 2019-07-17 DIAGNOSIS — M6289 Other specified disorders of muscle: Secondary | ICD-10-CM | POA: Diagnosis not present

## 2019-07-17 DIAGNOSIS — M62838 Other muscle spasm: Secondary | ICD-10-CM | POA: Diagnosis not present

## 2019-07-17 DIAGNOSIS — M7711 Lateral epicondylitis, right elbow: Secondary | ICD-10-CM | POA: Diagnosis not present

## 2019-07-24 DIAGNOSIS — M6289 Other specified disorders of muscle: Secondary | ICD-10-CM | POA: Diagnosis not present

## 2019-07-24 DIAGNOSIS — M62838 Other muscle spasm: Secondary | ICD-10-CM | POA: Diagnosis not present

## 2019-07-24 DIAGNOSIS — M7711 Lateral epicondylitis, right elbow: Secondary | ICD-10-CM | POA: Diagnosis not present

## 2019-07-31 DIAGNOSIS — M6289 Other specified disorders of muscle: Secondary | ICD-10-CM | POA: Diagnosis not present

## 2019-07-31 DIAGNOSIS — M62838 Other muscle spasm: Secondary | ICD-10-CM | POA: Diagnosis not present

## 2019-07-31 DIAGNOSIS — M7711 Lateral epicondylitis, right elbow: Secondary | ICD-10-CM | POA: Diagnosis not present

## 2019-08-08 DIAGNOSIS — M6289 Other specified disorders of muscle: Secondary | ICD-10-CM | POA: Diagnosis not present

## 2019-08-08 DIAGNOSIS — M7711 Lateral epicondylitis, right elbow: Secondary | ICD-10-CM | POA: Diagnosis not present

## 2019-08-08 DIAGNOSIS — M62838 Other muscle spasm: Secondary | ICD-10-CM | POA: Diagnosis not present

## 2019-08-17 DIAGNOSIS — M62838 Other muscle spasm: Secondary | ICD-10-CM | POA: Diagnosis not present

## 2019-08-17 DIAGNOSIS — M6289 Other specified disorders of muscle: Secondary | ICD-10-CM | POA: Diagnosis not present

## 2019-08-17 DIAGNOSIS — M7711 Lateral epicondylitis, right elbow: Secondary | ICD-10-CM | POA: Diagnosis not present

## 2019-10-31 DIAGNOSIS — G4700 Insomnia, unspecified: Secondary | ICD-10-CM | POA: Diagnosis not present

## 2019-10-31 DIAGNOSIS — M7711 Lateral epicondylitis, right elbow: Secondary | ICD-10-CM | POA: Diagnosis not present

## 2019-12-19 DIAGNOSIS — Z13220 Encounter for screening for lipoid disorders: Secondary | ICD-10-CM | POA: Diagnosis not present

## 2019-12-19 DIAGNOSIS — Z0000 Encounter for general adult medical examination without abnormal findings: Secondary | ICD-10-CM | POA: Diagnosis not present

## 2020-02-22 DIAGNOSIS — T50B95A Adverse effect of other viral vaccines, initial encounter: Secondary | ICD-10-CM | POA: Diagnosis not present

## 2020-02-22 DIAGNOSIS — L03114 Cellulitis of left upper limb: Secondary | ICD-10-CM | POA: Diagnosis not present

## 2020-02-25 DIAGNOSIS — T7840XD Allergy, unspecified, subsequent encounter: Secondary | ICD-10-CM | POA: Diagnosis not present

## 2020-03-26 ENCOUNTER — Other Ambulatory Visit: Payer: Self-pay | Admitting: Family Medicine

## 2020-03-26 DIAGNOSIS — Z1231 Encounter for screening mammogram for malignant neoplasm of breast: Secondary | ICD-10-CM

## 2020-04-07 DIAGNOSIS — S59911A Unspecified injury of right forearm, initial encounter: Secondary | ICD-10-CM | POA: Diagnosis not present

## 2020-04-07 DIAGNOSIS — S50911A Unspecified superficial injury of right forearm, initial encounter: Secondary | ICD-10-CM | POA: Diagnosis not present

## 2020-04-14 DIAGNOSIS — M79631 Pain in right forearm: Secondary | ICD-10-CM | POA: Diagnosis not present

## 2020-05-22 ENCOUNTER — Other Ambulatory Visit: Payer: Self-pay | Admitting: Gastroenterology

## 2020-05-22 DIAGNOSIS — B181 Chronic viral hepatitis B without delta-agent: Secondary | ICD-10-CM

## 2020-05-22 DIAGNOSIS — K219 Gastro-esophageal reflux disease without esophagitis: Secondary | ICD-10-CM | POA: Diagnosis not present

## 2020-05-22 DIAGNOSIS — L659 Nonscarring hair loss, unspecified: Secondary | ICD-10-CM | POA: Diagnosis not present

## 2020-05-26 ENCOUNTER — Other Ambulatory Visit: Payer: Self-pay

## 2020-05-26 ENCOUNTER — Ambulatory Visit
Admission: RE | Admit: 2020-05-26 | Discharge: 2020-05-26 | Disposition: A | Discharge status: Home / Self Care | Payer: BC Managed Care – PPO | Source: Ambulatory Visit | Attending: Family Medicine | Admitting: Family Medicine

## 2020-05-26 DIAGNOSIS — Z1231 Encounter for screening mammogram for malignant neoplasm of breast: Secondary | ICD-10-CM | POA: Diagnosis not present

## 2020-05-27 ENCOUNTER — Other Ambulatory Visit: Payer: Self-pay | Admitting: Family Medicine

## 2020-05-27 DIAGNOSIS — R928 Other abnormal and inconclusive findings on diagnostic imaging of breast: Secondary | ICD-10-CM

## 2020-06-04 ENCOUNTER — Other Ambulatory Visit: Payer: BC Managed Care – PPO

## 2020-06-04 DIAGNOSIS — R059 Cough, unspecified: Secondary | ICD-10-CM | POA: Diagnosis not present

## 2020-06-04 DIAGNOSIS — J40 Bronchitis, not specified as acute or chronic: Secondary | ICD-10-CM | POA: Diagnosis not present

## 2020-06-04 DIAGNOSIS — U071 COVID-19: Secondary | ICD-10-CM | POA: Diagnosis not present

## 2020-06-19 ENCOUNTER — Ambulatory Visit
Admission: RE | Admit: 2020-06-19 | Discharge: 2020-06-19 | Disposition: A | Discharge status: Home / Self Care | Payer: BC Managed Care – PPO | Source: Ambulatory Visit | Attending: Gastroenterology | Admitting: Gastroenterology

## 2020-06-19 DIAGNOSIS — B1910 Unspecified viral hepatitis B without hepatic coma: Secondary | ICD-10-CM | POA: Diagnosis not present

## 2020-06-19 DIAGNOSIS — B181 Chronic viral hepatitis B without delta-agent: Secondary | ICD-10-CM

## 2020-09-01 ENCOUNTER — Ambulatory Visit
Admission: RE | Admit: 2020-09-01 | Discharge: 2020-09-01 | Disposition: A | Discharge status: Home / Self Care | Payer: BC Managed Care – PPO | Source: Ambulatory Visit | Attending: Family Medicine | Admitting: Family Medicine

## 2020-09-01 ENCOUNTER — Other Ambulatory Visit: Payer: Self-pay

## 2020-09-01 DIAGNOSIS — R928 Other abnormal and inconclusive findings on diagnostic imaging of breast: Secondary | ICD-10-CM

## 2020-09-01 DIAGNOSIS — R922 Inconclusive mammogram: Secondary | ICD-10-CM | POA: Diagnosis not present

## 2020-10-09 DIAGNOSIS — R1013 Epigastric pain: Secondary | ICD-10-CM | POA: Diagnosis not present

## 2020-10-09 DIAGNOSIS — K219 Gastro-esophageal reflux disease without esophagitis: Secondary | ICD-10-CM | POA: Diagnosis not present

## 2020-10-29 DIAGNOSIS — K2930 Chronic superficial gastritis without bleeding: Secondary | ICD-10-CM | POA: Diagnosis not present

## 2020-10-29 DIAGNOSIS — R1013 Epigastric pain: Secondary | ICD-10-CM | POA: Diagnosis not present

## 2020-10-29 DIAGNOSIS — K219 Gastro-esophageal reflux disease without esophagitis: Secondary | ICD-10-CM | POA: Diagnosis not present

## 2020-12-24 DIAGNOSIS — G4700 Insomnia, unspecified: Secondary | ICD-10-CM | POA: Diagnosis not present

## 2020-12-24 DIAGNOSIS — K219 Gastro-esophageal reflux disease without esophagitis: Secondary | ICD-10-CM | POA: Diagnosis not present

## 2020-12-24 DIAGNOSIS — Z23 Encounter for immunization: Secondary | ICD-10-CM | POA: Diagnosis not present

## 2020-12-24 DIAGNOSIS — Z0000 Encounter for general adult medical examination without abnormal findings: Secondary | ICD-10-CM | POA: Diagnosis not present

## 2020-12-24 DIAGNOSIS — B181 Chronic viral hepatitis B without delta-agent: Secondary | ICD-10-CM | POA: Diagnosis not present

## 2020-12-24 DIAGNOSIS — Z13220 Encounter for screening for lipoid disorders: Secondary | ICD-10-CM | POA: Diagnosis not present

## 2021-06-29 DIAGNOSIS — K219 Gastro-esophageal reflux disease without esophagitis: Secondary | ICD-10-CM | POA: Diagnosis not present

## 2021-06-29 DIAGNOSIS — G4700 Insomnia, unspecified: Secondary | ICD-10-CM | POA: Diagnosis not present

## 2022-12-12 IMAGING — MG MM DIGITAL DIAGNOSTIC UNILAT*L* W/ TOMO W/ CAD
6 of 10 series · 6 of 30 positions shown · non-contrast
Comparison: Previous exam(s).
COMPARISON: Previous exam(s).
COMPARISON: Previous exam(s).

Addendum:
CLINICAL DATA: Patient returns after screening study for evaluation
of possible LEFT breast mass.

EXAM:
DIGITAL DIAGNOSTIC UNILATERAL LEFT MAMMOGRAM WITH TOMOSYNTHESIS AND
CAD
TECHNIQUE: Left digital diagnostic mammography and breast tomosynthesis was
performed. The images were evaluated with computer-aided detection.
LEFT BREAST ULTRASOUND

[L MLO synth-2D (1 of 3)]
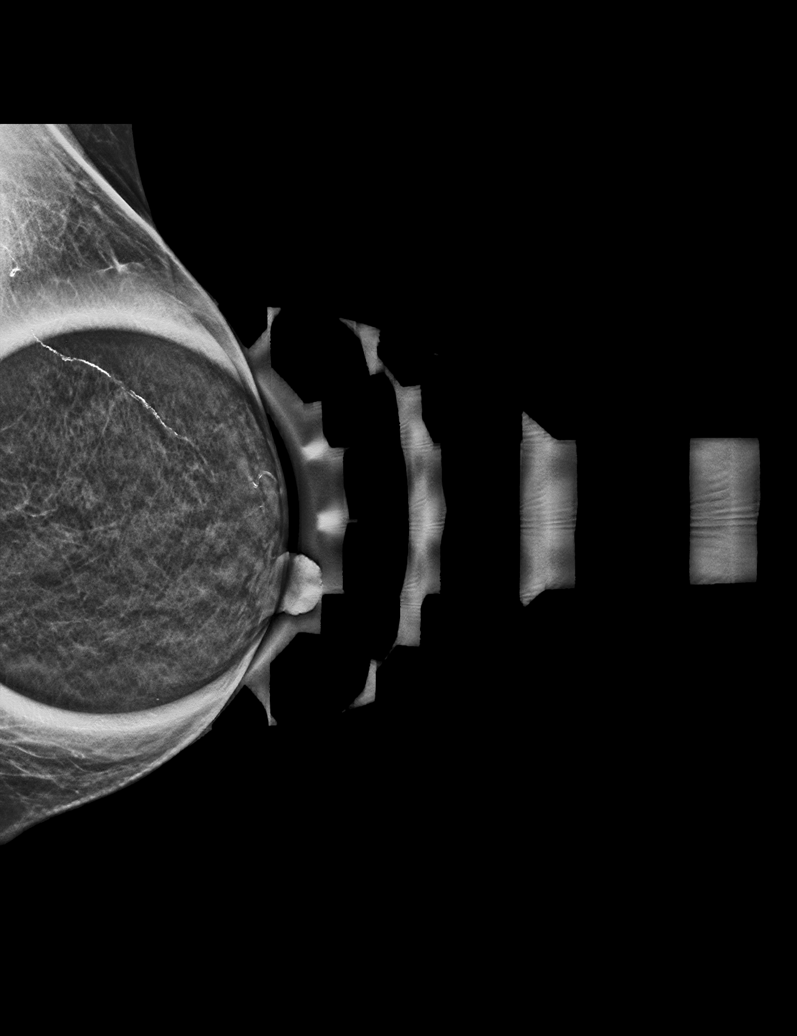

[L MLO synth-2D (2 of 3)]
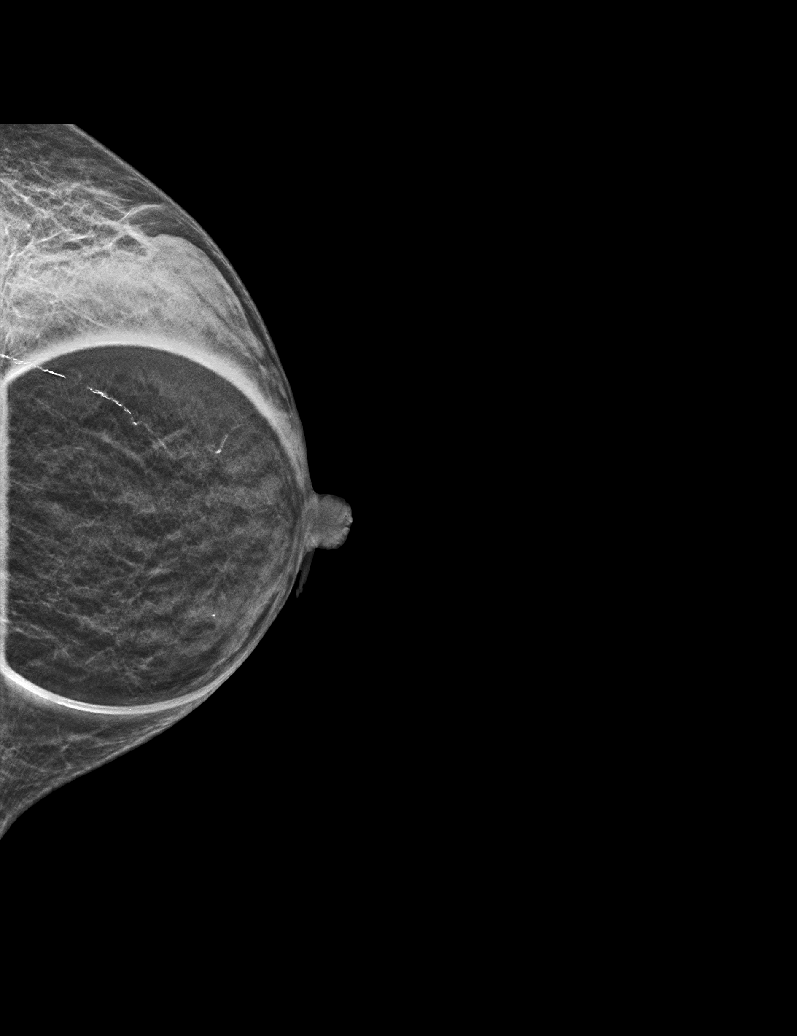

[L CC synth-2D (1 of 2)]
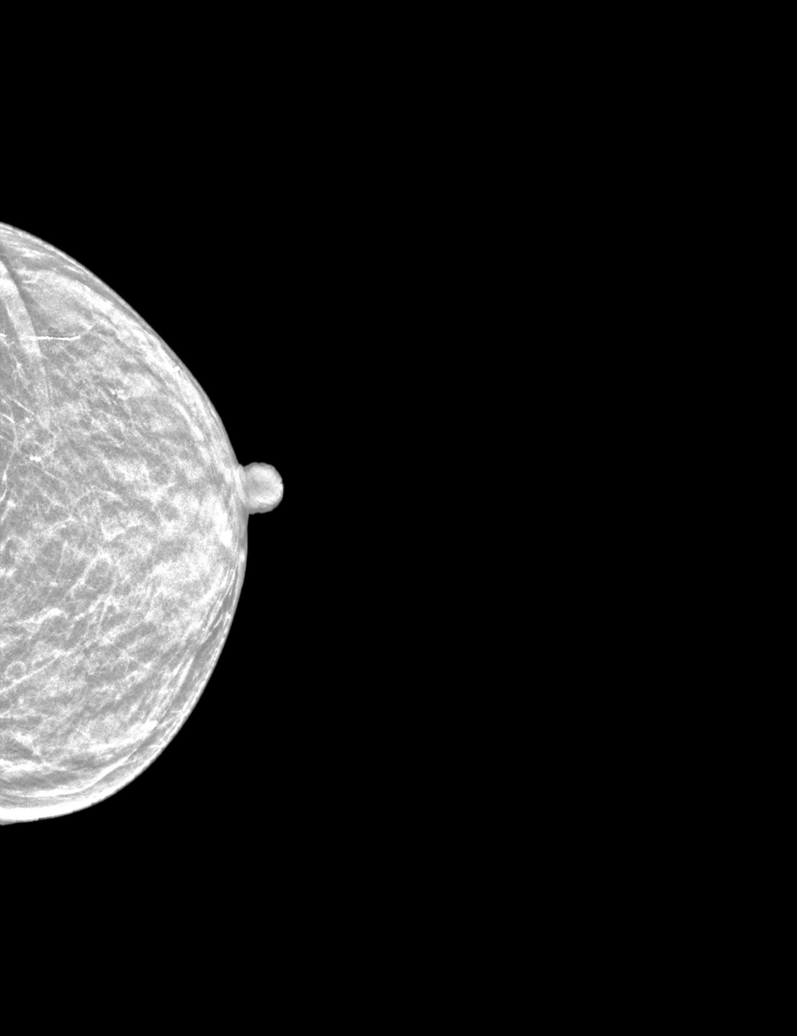

[L MLO synth-2D (3 of 3)]
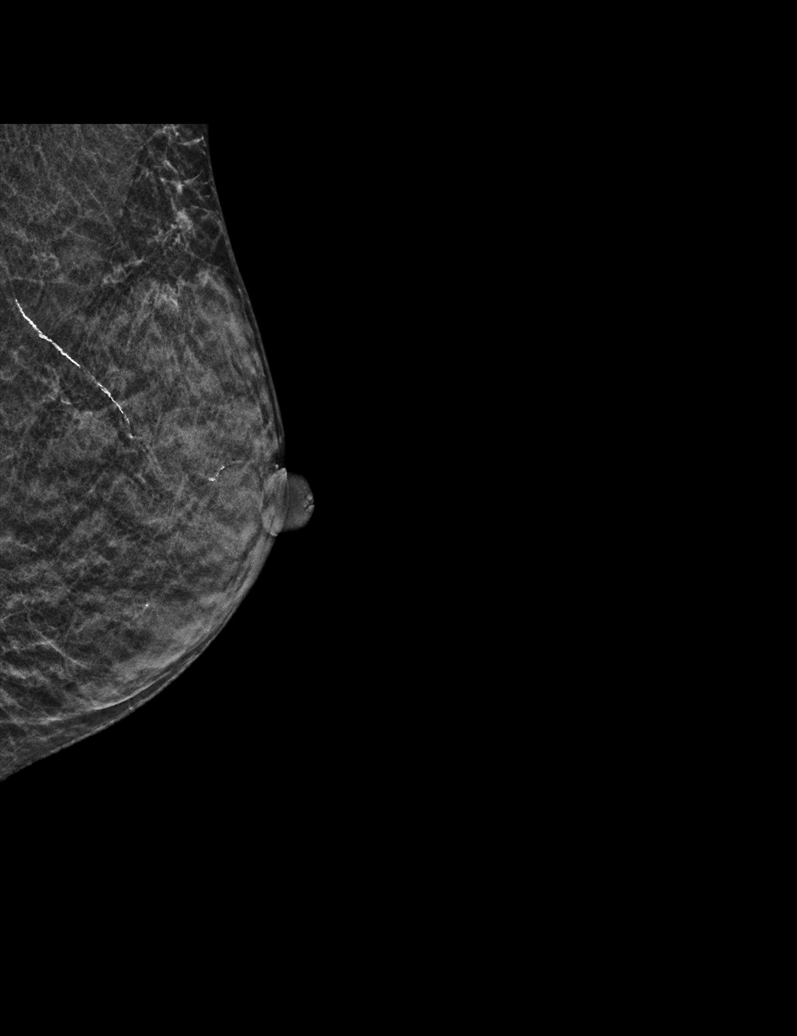

[L CC synth-2D (2 of 2)]
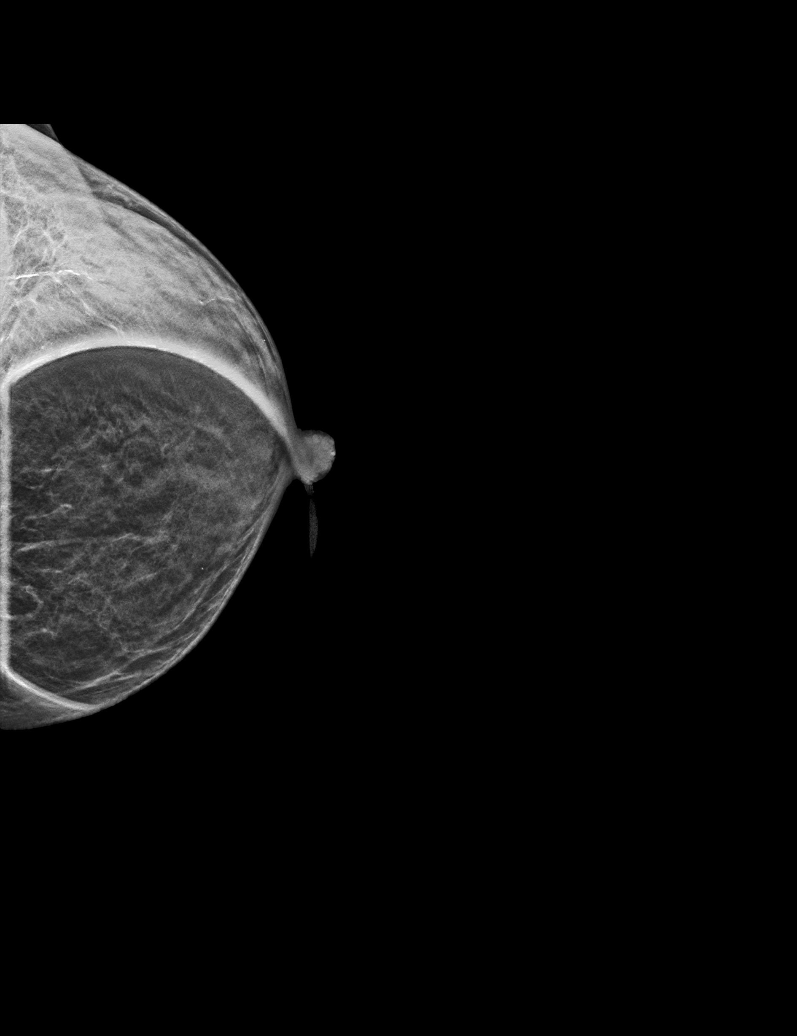

[L CC tomo · tomo slice 13/25.0]
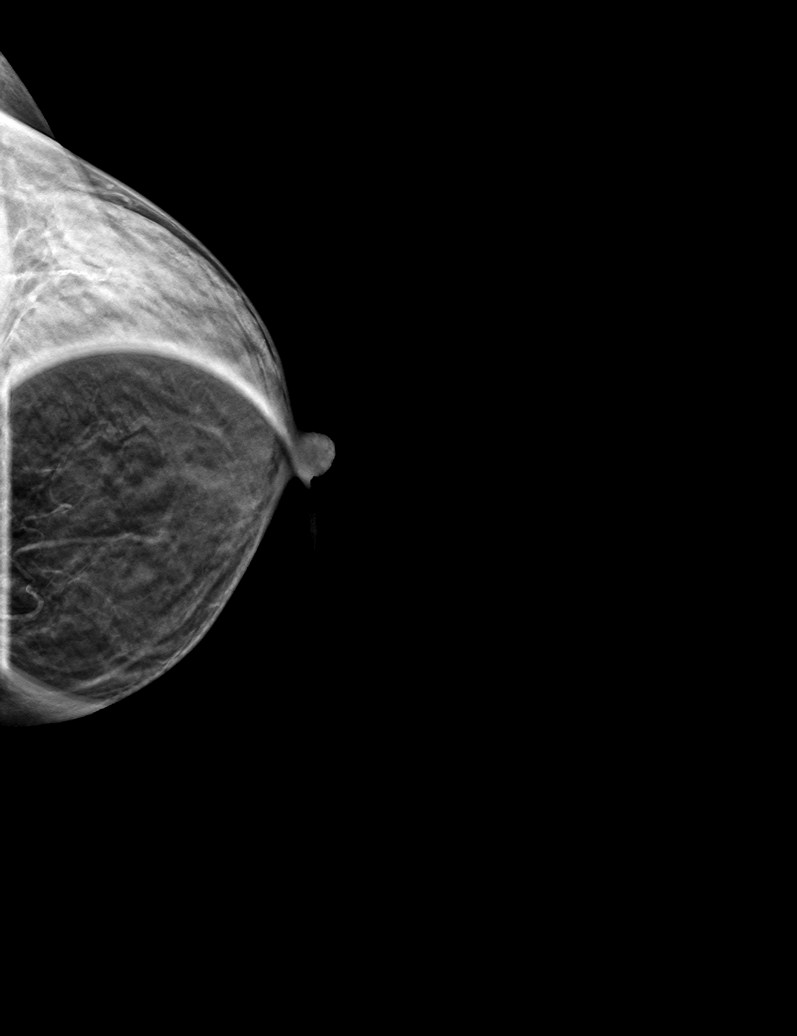

[6 of 30 positions shown; findings below may reference images not displayed]

ACR Breast Density Category d: The breast tissue is extremely dense,
which lowers the sensitivity of mammography.
FINDINGS: Additional 2-D and 3-D images are performed. These views show dense
fibroglandular tissue without definite mass in the MEDIAL or
retroareolar regions of the LEFT breast.

On physical exam, I palpate no abnormality MEDIAL or central
portions of the RIGHT breast.

Targeted ultrasound is performed, showing normal appearing, dense
fibroglandular tissue throughout the MEDIAL and central portions of
the LEFT breast. No suspicious mass, distortion, or acoustic
shadowing is demonstrated with ultrasound.
IMPRESSION: No mammographic or ultrasound evidence for malignancy.

RECOMMENDATION:
Screening mammogram in one year.(Code:ZD-6-1JR)

I have discussed the findings and recommendations with the patient.
If applicable, a reminder letter will be sent to the patient
regarding the next appointment.

BI-RADS CATEGORY  1: Negative.

ADDENDUM:
CORRECTED REPORT:
ACR Breast Density Category d: The breast tissue is extremely dense,

which lowers the sensitivity of mammography.
FINDINGS: Additional 2-D and 3-D images are performed. These views show dense

fibroglandular tissue without definite mass in the MEDIAL or

retroareolar regions of the LEFT breast.

On physical exam, I palpate no abnormality MEDIAL or central

portions of the LEFT breast.

Targeted ultrasound is performed, showing normal appearing, dense

fibroglandular tissue throughout the MEDIAL and central portions of

the LEFT breast. No suspicious mass, distortion, or acoustic

shadowing is demonstrated with ultrasound.
IMPRESSION: No mammographic or ultrasound evidence for malignancy.

RECOMMENDATION:

Screening mammogram in one year.(Code:ZD-6-1JR)

I have discussed the findings and recommendations with the patient.

If applicable, a reminder letter will be sent to the patient

regarding the next appointment.

BI-RADS CATEGORY  1: Negative.

*** End of Addendum ***
ACR Breast Density Category d: The breast tissue is extremely dense,
which lowers the sensitivity of mammography.
FINDINGS: Additional 2-D and 3-D images are performed. These views show dense
fibroglandular tissue without definite mass in the MEDIAL or
retroareolar regions of the LEFT breast.

On physical exam, I palpate no abnormality MEDIAL or central
portions of the RIGHT breast.

Targeted ultrasound is performed, showing normal appearing, dense
fibroglandular tissue throughout the MEDIAL and central portions of
the LEFT breast. No suspicious mass, distortion, or acoustic
shadowing is demonstrated with ultrasound.
IMPRESSION: No mammographic or ultrasound evidence for malignancy.

RECOMMENDATION:
Screening mammogram in one year.(Code:ZD-6-1JR)

I have discussed the findings and recommendations with the patient.
If applicable, a reminder letter will be sent to the patient
regarding the next appointment.

BI-RADS CATEGORY  1: Negative.
